# Patient Record
Sex: Male | Born: 1970 | Race: Black or African American | Hispanic: No | Marital: Married | Smoking: Never smoker
Health system: Southern US, Community
[De-identification: ages and names within clinical notes are randomized; demographics above are authoritative.]

## PROBLEM LIST (undated history)

## (undated) DIAGNOSIS — E785 Hyperlipidemia, unspecified: Secondary | ICD-10-CM

## (undated) DIAGNOSIS — Z789 Other specified health status: Secondary | ICD-10-CM

## (undated) HISTORY — PX: ACHILLES TENDON REPAIR: SUR1153

---

## 1998-11-17 ENCOUNTER — Emergency Department: Admit: 1998-11-17 | Payer: Self-pay | Admitting: Emergency Medicine

## 2003-08-06 ENCOUNTER — Emergency Department (HOSPITAL_COMMUNITY): Admission: EM | Admit: 2003-08-06 | Discharge: 2003-08-06 | Payer: Self-pay | Admitting: *Deleted

## 2012-08-27 ENCOUNTER — Ambulatory Visit (HOSPITAL_COMMUNITY): Admit: 2012-08-27 | Payer: Self-pay | Admitting: Cardiology

## 2012-08-27 ENCOUNTER — Inpatient Hospital Stay (HOSPITAL_COMMUNITY)
Admission: EM | Admit: 2012-08-27 | Discharge: 2012-08-30 | DRG: 853 | Disposition: A | Discharge status: Home / Self Care | Payer: BC Managed Care – PPO | Attending: Cardiology | Admitting: Cardiology

## 2012-08-27 ENCOUNTER — Encounter (HOSPITAL_COMMUNITY): Payer: Self-pay | Admitting: *Deleted

## 2012-08-27 ENCOUNTER — Emergency Department (HOSPITAL_COMMUNITY): Payer: BC Managed Care – PPO

## 2012-08-27 ENCOUNTER — Encounter (HOSPITAL_COMMUNITY)
Admission: EM | Disposition: A | Discharge status: Home / Self Care | Payer: Self-pay | Source: Home / Self Care | Attending: Cardiology

## 2012-08-27 DIAGNOSIS — I219 Acute myocardial infarction, unspecified: Secondary | ICD-10-CM

## 2012-08-27 DIAGNOSIS — I517 Cardiomegaly: Secondary | ICD-10-CM

## 2012-08-27 DIAGNOSIS — I214 Non-ST elevation (NSTEMI) myocardial infarction: Secondary | ICD-10-CM | POA: Diagnosis present

## 2012-08-27 DIAGNOSIS — I2589 Other forms of chronic ischemic heart disease: Secondary | ICD-10-CM | POA: Diagnosis present

## 2012-08-27 DIAGNOSIS — Z7902 Long term (current) use of antithrombotics/antiplatelets: Secondary | ICD-10-CM

## 2012-08-27 DIAGNOSIS — I472 Ventricular tachycardia, unspecified: Secondary | ICD-10-CM | POA: Diagnosis not present

## 2012-08-27 DIAGNOSIS — I2510 Atherosclerotic heart disease of native coronary artery without angina pectoris: Secondary | ICD-10-CM | POA: Diagnosis present

## 2012-08-27 DIAGNOSIS — Z7982 Long term (current) use of aspirin: Secondary | ICD-10-CM

## 2012-08-27 DIAGNOSIS — E782 Mixed hyperlipidemia: Secondary | ICD-10-CM | POA: Diagnosis present

## 2012-08-27 DIAGNOSIS — Z79899 Other long term (current) drug therapy: Secondary | ICD-10-CM

## 2012-08-27 DIAGNOSIS — I4729 Other ventricular tachycardia: Secondary | ICD-10-CM | POA: Diagnosis not present

## 2012-08-27 DIAGNOSIS — I255 Ischemic cardiomyopathy: Secondary | ICD-10-CM

## 2012-08-27 DIAGNOSIS — R079 Chest pain, unspecified: Secondary | ICD-10-CM | POA: Diagnosis present

## 2012-08-27 HISTORY — PX: CARDIAC CATHETERIZATION: SHX172

## 2012-08-27 HISTORY — DX: Acute myocardial infarction, unspecified: I21.9

## 2012-08-27 HISTORY — DX: Other specified health status: Z78.9

## 2012-08-27 HISTORY — PX: LEFT HEART CATHETERIZATION WITH CORONARY ANGIOGRAM: SHX5451

## 2012-08-27 LAB — COMPREHENSIVE METABOLIC PANEL
ALT: 53 U/L (ref 0–53)
CO2: 23 mEq/L (ref 19–32)
Calcium: 8.6 mg/dL (ref 8.4–10.5)
Creatinine, Ser: 1.13 mg/dL (ref 0.50–1.35)
GFR calc Af Amer: >90 mL/min (ref >90)
GFR calc non Af Amer: 79 mL/min — ABNORMAL LOW (ref >90)
Glucose, Bld: 126 mg/dL — ABNORMAL HIGH (ref 70–99)
Total Bilirubin: 0.5 mg/dL (ref 0.3–1.2)

## 2012-08-27 LAB — POCT I-STAT TROPONIN I: Troponin i, poc: 0.24 ng/mL (ref 0.00–0.08)

## 2012-08-27 LAB — BASIC METABOLIC PANEL
Calcium: 9.1 mg/dL (ref 8.4–10.5)
GFR calc Af Amer: 84 mL/min — ABNORMAL LOW (ref >90)
GFR calc non Af Amer: 72 mL/min — ABNORMAL LOW (ref >90)
Potassium: 4 mEq/L (ref 3.5–5.1)
Sodium: 136 mEq/L (ref 135–145)

## 2012-08-27 LAB — TROPONIN I
Troponin I: 12.96 ng/mL (ref <0.30)
Troponin I: 12.64 ng/mL (ref <0.30)

## 2012-08-27 LAB — HEMOGLOBIN A1C: Mean Plasma Glucose: 117 mg/dL — ABNORMAL HIGH (ref <117)

## 2012-08-27 LAB — CBC
MCH: 29.8 pg (ref 26.0–34.0)
MCHC: 36.5 g/dL — ABNORMAL HIGH (ref 30.0–36.0)
Platelets: 202 10*3/uL (ref 150–400)
RDW: 12.9 % (ref 11.5–15.5)

## 2012-08-27 LAB — PROTIME-INR
INR: 0.93 (ref 0.00–1.49)
Prothrombin Time: 13 seconds (ref 11.6–15.2)

## 2012-08-27 LAB — MRSA PCR SCREENING: MRSA by PCR: NEGATIVE

## 2012-08-27 SURGERY — LEFT HEART CATHETERIZATION WITH CORONARY ANGIOGRAM
Anesthesia: LOCAL

## 2012-08-27 MED ORDER — ASPIRIN EC 81 MG PO TBEC
81.0000 mg | DELAYED_RELEASE_TABLET | Freq: Every day | ORAL | Status: DC
  Administered 2012-08-28 – 2012-08-30 (×3): 81 mg via ORAL
  Filled 2012-08-27 (×3): qty 1

## 2012-08-27 MED ORDER — IOHEXOL 350 MG/ML SOLN
100.0000 mL | Freq: Once | INTRAVENOUS | Status: AC | PRN
  Administered 2012-08-27: 100 mL via INTRAVENOUS

## 2012-08-27 MED ORDER — NITROGLYCERIN 0.4 MG SL SUBL: 0.4000 mg | SUBLINGUAL_TABLET | SUBLINGUAL | Status: DC | PRN

## 2012-08-27 MED ORDER — LIDOCAINE HCL (PF) 1 % IJ SOLN
INTRAMUSCULAR | Status: AC
  Filled 2012-08-27: qty 30

## 2012-08-27 MED ORDER — DIAZEPAM 5 MG PO TABS
ORAL_TABLET | ORAL | Status: AC
  Administered 2012-08-27: 5 mg
  Filled 2012-08-27: qty 1

## 2012-08-27 MED ORDER — SODIUM CHLORIDE 0.9 % IV SOLN: 250.0000 mL | INTRAVENOUS | Status: DC | PRN

## 2012-08-27 MED ORDER — NITROGLYCERIN IN D5W 200-5 MCG/ML-% IV SOLN
10.0000 ug/min | INTRAVENOUS | Status: DC
  Administered 2012-08-27: 10 ug/min via INTRAVENOUS
  Filled 2012-08-27: qty 250

## 2012-08-27 MED ORDER — MORPHINE SULFATE 4 MG/ML IJ SOLN
4.0000 mg | Freq: Once | INTRAMUSCULAR | Status: AC
  Administered 2012-08-27: 4 mg via INTRAVENOUS
  Filled 2012-08-27: qty 1

## 2012-08-27 MED ORDER — DIAZEPAM 5 MG PO TABS
5.0000 mg | ORAL_TABLET | ORAL | Status: AC
  Administered 2012-08-27: 5 mg via ORAL

## 2012-08-27 MED ORDER — MIDAZOLAM HCL 2 MG/2ML IJ SOLN
INTRAMUSCULAR | Status: AC
  Filled 2012-08-27: qty 2

## 2012-08-27 MED ORDER — SODIUM CHLORIDE 0.9 % IJ SOLN: 3.0000 mL | INTRAMUSCULAR | Status: DC | PRN

## 2012-08-27 MED ORDER — ONDANSETRON HCL 4 MG/2ML IJ SOLN: 4.0000 mg | Freq: Four times a day (QID) | INTRAMUSCULAR | Status: DC | PRN

## 2012-08-27 MED ORDER — VERAPAMIL HCL 2.5 MG/ML IV SOLN
INTRAVENOUS | Status: AC
  Filled 2012-08-27: qty 2

## 2012-08-27 MED ORDER — HEPARIN SODIUM (PORCINE) 5000 UNIT/ML IJ SOLN
5000.0000 [IU] | Freq: Three times a day (TID) | INTRAMUSCULAR | Status: DC
  Administered 2012-08-27 – 2012-08-30 (×7): 5000 [IU] via SUBCUTANEOUS
  Filled 2012-08-27 (×11): qty 1

## 2012-08-27 MED ORDER — SODIUM CHLORIDE 0.9 % IJ SOLN: 3.0000 mL | Freq: Two times a day (BID) | INTRAMUSCULAR | Status: DC

## 2012-08-27 MED ORDER — HEPARIN BOLUS VIA INFUSION
1400.0000 [IU] | Freq: Once | INTRAVENOUS | Status: AC
  Administered 2012-08-27: 1400 [IU] via INTRAVENOUS
  Filled 2012-08-27: qty 1400

## 2012-08-27 MED ORDER — ATORVASTATIN CALCIUM 40 MG PO TABS
40.0000 mg | ORAL_TABLET | Freq: Every day | ORAL | Status: DC
  Administered 2012-08-27 – 2012-08-29 (×3): 40 mg via ORAL
  Filled 2012-08-27 (×4): qty 1

## 2012-08-27 MED ORDER — METOPROLOL TARTRATE 25 MG PO TABS
25.0000 mg | ORAL_TABLET | Freq: Two times a day (BID) | ORAL | Status: DC
  Administered 2012-08-27: 25 mg via ORAL
  Filled 2012-08-27 (×3): qty 1

## 2012-08-27 MED ORDER — ASPIRIN 81 MG PO CHEW
324.0000 mg | CHEWABLE_TABLET | Freq: Once | ORAL | Status: AC
  Administered 2012-08-27: 324 mg via ORAL
  Filled 2012-08-27: qty 1

## 2012-08-27 MED ORDER — HEPARIN (PORCINE) IN NACL 100-0.45 UNIT/ML-% IJ SOLN
1500.0000 [IU]/h | INTRAMUSCULAR | Status: DC
  Administered 2012-08-27: 1300 [IU]/h via INTRAVENOUS
  Administered 2012-08-27: 1500 [IU]/h via INTRAVENOUS
  Filled 2012-08-27 (×2): qty 250

## 2012-08-27 MED ORDER — SODIUM CHLORIDE 0.9 % IV SOLN: 1.0000 mL/kg/h | INTRAVENOUS | Status: AC

## 2012-08-27 MED ORDER — HEPARIN SODIUM (PORCINE) 1000 UNIT/ML IJ SOLN
INTRAMUSCULAR | Status: AC
  Filled 2012-08-27: qty 1

## 2012-08-27 MED ORDER — HEPARIN (PORCINE) IN NACL 2-0.9 UNIT/ML-% IJ SOLN
INTRAMUSCULAR | Status: AC
  Filled 2012-08-27: qty 1000

## 2012-08-27 MED ORDER — PRASUGREL HCL 10 MG PO TABS
ORAL_TABLET | ORAL | Status: AC
Start: 2012-08-27 — End: 2012-08-27
  Filled 2012-08-27: qty 6

## 2012-08-27 MED ORDER — PRASUGREL HCL 10 MG PO TABS
10.0000 mg | ORAL_TABLET | Freq: Every day | ORAL | Status: DC
  Administered 2012-08-28 – 2012-08-30 (×3): 10 mg via ORAL
  Filled 2012-08-27 (×3): qty 1

## 2012-08-27 MED ORDER — SODIUM CHLORIDE 0.9 % IV SOLN: INTRAVENOUS | Status: DC

## 2012-08-27 MED ORDER — HEPARIN BOLUS VIA INFUSION
4000.0000 [IU] | Freq: Once | INTRAVENOUS | Status: AC
  Administered 2012-08-27: 4000 [IU] via INTRAVENOUS

## 2012-08-27 MED ORDER — ACETAMINOPHEN 325 MG PO TABS
650.0000 mg | ORAL_TABLET | ORAL | Status: DC | PRN
  Administered 2012-08-27: 650 mg via ORAL
  Filled 2012-08-27: qty 2

## 2012-08-27 MED ORDER — ASPIRIN 81 MG PO CHEW
CHEWABLE_TABLET | ORAL | Status: AC
  Filled 2012-08-27: qty 1

## 2012-08-27 MED ORDER — SODIUM CHLORIDE 0.9 % IV SOLN
INTRAVENOUS | Status: DC
  Administered 2012-08-27: 10 mL/h via INTRAVENOUS

## 2012-08-27 MED ORDER — ASPIRIN 81 MG PO CHEW: 324.0000 mg | CHEWABLE_TABLET | ORAL | Status: DC

## 2012-08-27 NOTE — ED Notes (Signed)
Dr. Turner at [facility].  

## 2012-08-27 NOTE — ED Notes (Signed)
EDP at [facility] 

## 2012-08-27 NOTE — Progress Notes (Signed)
  Echocardiogram 2D Echocardiogram has been performed.  Hailyn Zarr 08/27/2012, 1:13 PM

## 2012-08-27 NOTE — Interval H&P Note (Signed)
History and Physical Interval Note:  08/27/2012 11:02 AM  Ian Martin  has presented today for surgery, with the diagnosis of URGENT  The various methods of treatment have been discussed with the patient and family. After consideration of risks, benefits and other options for treatment, the patient has consented to  Procedure(s): LEFT HEART CATHETERIZATION WITH CORONARY ANGIOGRAM (N/A) as a surgical intervention .  The patient's history has been reviewed, patient examined, no change in status, stable for surgery.  I have reviewed the patient's chart and labs.  Questions were answered to the patient's satisfaction.    Cath Lab Visit (complete for each Cath Lab visit)  Clinical Evaluation Leading to the Procedure:   ACS: yes  Non-ACS:    Anginal Classification: CCS IV  Anti-ischemic medical therapy: No Therapy  Non-Invasive Test Results: No non-invasive testing performed  Prior CABG: No previous CABG       [name] [facility] 08/27/2012 11:03 AM

## 2012-08-27 NOTE — H&P (Addendum)
Admit date: 08/27/2012 Referring Physician Dr. Preston Fleeting Primary Cardiologist NONE Chief complaint/reason for admission: Chest pain/palpitations  HPI: This is a 42yo AAM with no prior medical history who presented to the Er with complaints of sharp chest pain.  He says that Thursday he found out his grandmother was back in the hospital and he started having CP and diaphoresis.  He thought it was anxiety and went home and took some ASA and laid down and it improved.  He got up to get his wife from the train station and he was still having pain but it was improved.  The pain lasted about 4 hours and completely stopped.  He drove from [place] a week ago visiting his family.  Last night the pain came back while at dinner.  He became diaphoretic and took a BC powder and a bayer ASA.  He has also noticed his heart racing last night as well.  He felt dizzy but did not pass out.  He had a hard time telling his wife how to get to the Er and was confused.  He describes the pain as an intense sharp pain over the midsternal area.  He also describes a tightness in his chest as well.  Currently he is pain free.    PMH:   History reviewed. No pertinent past medical history.  PSH:    Past Surgical History  Procedure Laterality Date  . Achilles tendon repair      ALLERGIES:   Review of patient's allergies indicates no known allergies.  Prior to Admit Meds:   (Not in a hospital admission) Family HX:   No family history of CAD. Social HX:    History   Social History  . Marital Status: Single    Spouse Name: N/A    Number of Children: N/A  . Years of Education: N/A   Occupational History  . Not on file.   Social History Main Topics  . Smoking status: Never Smoker   . Smokeless tobacco: Not on file  . Alcohol Use: Yes - occasional  . Drug Use: No  . Sexual Activity: Not on file   Other Topics Concern  . Not on file   Social History Narrative  . No narrative on file     ROS:  All 11 ROS were addressed  and are negative except what is stated in the HPI  PHYSICAL EXAM Filed Vitals:   08/27/12 0445  BP: 124/83  Pulse: 86  Temp:   Resp: 14   General: Well developed, well nourished, in no acute distress Head: Eyes PERRLA, No xanthomas.   Normal cephalic and atramatic  Lungs:   Clear bilaterally to auscultation and percussion. Heart:   HRRR S1 S2 Pulses are 2+ & equal.            No carotid bruit. No JVD.  No abdominal bruits. No femoral bruits. Abdomen: Bowel sounds are positive, abdomen soft and non-tender without masses  Extremities:   No clubbing, cyanosis or edema.  DP +1 Neuro: Alert and oriented X 3. Psych:  Good affect, responds appropriately   Labs:   Lab Results  Component Value Date   WBC 7.2 08/27/2012   HGB 17.6* 08/27/2012   HCT 48.2 08/27/2012   MCV 81.7 08/27/2012   PLT 202 08/27/2012    Recent Labs Lab 08/27/12 0204  NA 136  K 4.0  CL 99  CO2 27  BUN 14  CREATININE 1.21  CALCIUM 9.1  GLUCOSE 150*  Lab Results  Component Value Date   TROPONINI 1.20* 08/27/2012   No results found for this basename: PTT   Lab Results  Component Value Date   INR 0.93 08/27/2012         Radiology:   *RADIOLOGY REPORT*  Clinical Data: Chest pain  CHEST - 2 VIEW  Comparison: None.  Findings: The lungs are well-aerated and free from pulmonary  edema, focal airspace consolidation or pulmonary nodule. Cardiac  and mediastinal contours are within normal limits. No  pneumothorax, or pleural effusion. No acute osseous findings.  IMPRESSION:  No acute cardiopulmonary disease.  Original Report Authenticated By: Malachy Moan, M.D.       EKG:  NSR with no ST changes, Q waves in III and aVF, poor R wave progression in anterior precordial leads  ASSESSMENT:  1.  NSTEMI with some typical and atypical components to his chest pain.  Prelim CT angio with ? Subsegmental PE but could be artifact per Radiologist.  He has no cardiac risk factors except male sex and age over  42.  This did occur in the setting of severe stress after finding out his grandmother was in the hospital so could represent "Broken Heart syndrome". 2.  Palpitations with NSVT noted on heart monitor in ER as well as AIVR which could represent reperfusion arrhythmia.  PLAN:   1.  Admit to CCU 2.  Cycle cardiac enzymes 3.  IV Heparin and NTG gtt - if no dissection 4.  2D echo today to eval LVF 5.  ASA/statin/Lopressor 25mg  BID 6.  Further workup per [facility] with cardiac cath  7.  Check final reading on chest CT that was not yet reported at time of this dictation. Quintella Reichert, MD  08/27/2012  6:33 AM

## 2012-08-27 NOTE — Progress Notes (Signed)
ANTICOAGULATION CONSULT NOTE - Initial Consult  Pharmacy Consult for Heparin Indication: chest pain/ACS  No Known Allergies  Patient Measurements:  Weight: 115kg Heparin Dosing Weight: 98.5kg  Vital Signs: Temp: 98.8 F (37.1 C) (08/16 0154) Temp src: Oral (08/16 0154) BP: 132/83 mmHg (08/16 0321) Pulse Rate: 85 (08/16 0321)  Labs:  Recent Labs  08/27/12 0204  HGB 17.6*  HCT 48.2  PLT 202  CREATININE 1.21    CrCl is unknown because there is no height on file for the current visit.   Medical History: History reviewed. No pertinent past medical history.  Medications:  See electronic med rec  Assessment: Ian Martin admitted with CP to start heparin for ACS. Patient reports no bleeding and not on any anticoagulants. - H/H and Plts wnl - Heparin weight: 98.5kg  Goal of Therapy:  Heparin level 0.3-0.7 units/ml Monitor platelets by anticoagulation protocol: Yes   Plan:  1. Heparin IV bolus 4000 units x 1 2. Heparin drip 1300 units/hr (13 ml/hr) 3. Check heparin level 6 hours after initiation 4. Daily heparin level and CBC  [name] [phone] 08/27/2012,3:25 AM

## 2012-08-27 NOTE — ED Notes (Signed)
The pt has had mid chest pain for 3 days.  He was seen at ucc  2 days ago and was told to return here if the pain continued.   C/o sob  None now.  No cardiac history

## 2012-08-27 NOTE — Progress Notes (Signed)
Chaplain Note:  Chaplain responded to code stemi. Pt was in his CICU room, in bed, awake, alert, and fully oriented.  Pt's wife was at bedside.  Chaplain provided spiritual comfort support and prayer for pt, pt's wife, and cath lab team prior to pt's procedure.  During procedure, chaplain provided communication between cath lab and pt's wife and family that arrived later.  Pt, family, and cath lab staff expressed appreciation for chaplain support.  Chaplain will follow up as needed.  08/27/12 1100  Clinical Encounter Type  Visited With Patient;Family;Health care provider  Visit Type Spiritual support;Code (Code Stemi)  Referral From Physician  Spiritual Encounters  Spiritual Needs Prayer;Emotional  Stress Factors  Patient Stress Factors Health changes;Major life changes  Family Stress Factors Major life changes  Verdie Shire, [place]  [phone]

## 2012-08-27 NOTE — Progress Notes (Signed)
DR. [place] NOTIFIED FOR RIGHT RADIAL OOZING SITE.  TR BAND REINFLATED PER PROTOCOL.  WILL CONTINUE TO MONITOR.

## 2012-08-27 NOTE — Progress Notes (Signed)
Patient ID: Ian Martin, male   DOB: Apr 16, 1970, 42 y.o.   MRN: [id number]    SUBJECTIVE: The patient was admitted with chest pain this morning. There was slight troponin elevation initially. The patient is feeling better but has continued mild chest discomfort. Troponin has gone up to 12. Repeat EKG reveals new T wave abnormalities in leads V2 V3 V4 and V5. There are small inferior Q waves. These were present on the tracing earlier today.    Filed Vitals:   08/27/12 0815 08/27/12 0830 08/27/12 0845 08/27/12 0900  BP: 148/84 142/81 139/73 139/73  Pulse: 85 86 84 78  Temp: 98.3 F (36.8 C)   98.3 F (36.8 C)  TempSrc: Oral   Oral  Resp: 22 16 16    Height: 5\' 10"  (1.778 m)   5\' 10"  (1.778 m)  Weight: 250 lb (113.399 kg)   250 lb (113.399 kg)  SpO2: 100% 100% 100% 99%    Intake/Output Summary (Last 24 hours) at 08/27/12 1033 Last data filed at 08/27/12 0800  Gross per 24 hour  Intake  70.31 ml  Output    600 ml  Net -529.69 ml    LABS: Basic Metabolic Panel:  Recent Labs  40/98/11 0204 08/27/12 0848  NA 136 135  K 4.0 3.9  CL 99 102  CO2 27 23  GLUCOSE 150* 126*  BUN 14 13  CREATININE 1.21 1.13  CALCIUM 9.1 8.6   Liver Function Tests:  Recent Labs  08/27/12 0848  AST 99*  ALT 53  ALKPHOS 62  BILITOT 0.5  PROT 6.7  ALBUMIN 3.7   No results found for this basename: LIPASE, AMYLASE,  in the last 72 hours CBC:  Recent Labs  08/27/12 0204  WBC 7.2  HGB 17.6*  HCT 48.2  MCV 81.7  PLT 202   Cardiac Enzymes:  Recent Labs  08/27/12 0305 08/27/12 0848  TROPONINI 1.20* 12.96*   BNP: No components found with this basename: POCBNP,  D-Dimer: No results found for this basename: DDIMER,  in the last 72 hours Hemoglobin A1C: No results found for this basename: HGBA1C,  in the last 72 hours Fasting Lipid Panel: No results found for this basename: CHOL, HDL, LDLCALC, TRIG, CHOLHDL, LDLDIRECT,  in the last 72 hours Thyroid Function Tests: No results  found for this basename: TSH, T4TOTAL, FREET3, T3FREE, THYROIDAB,  in the last 72 hours  RADIOLOGY: Dg Chest 2 View  08/27/2012   *RADIOLOGY REPORT*  Clinical Data: Chest pain  CHEST - 2 VIEW  Comparison: None.  Findings: The lungs are well-aerated and free from pulmonary edema, focal airspace consolidation or pulmonary nodule.  Cardiac and mediastinal contours are within normal limits.  No pneumothorax, or pleural effusion. No acute osseous findings.  IMPRESSION:  No acute cardiopulmonary disease.   Original Report Authenticated By: Malachy Moan, M.D.   Ct Angio Chest Pe W/cm &/or Wo Cm  08/27/2012   *RADIOLOGY REPORT*  Clinical Data: Chest pain  CT ANGIOGRAPHY CHEST  Technique:  Multidetector CT imaging of the chest using the standard protocol during bolus administration of intravenous contrast. Multiplanar reconstructed images including MIPs were obtained and reviewed to evaluate the vascular anatomy.  Contrast: OMNIPAQUE IOHEXOL 350 MG/ML SOLN  Comparison: Chest x-ray obtained earlier today  Findings:  Mediastinum: Unremarkable CT appearance of the thyroid gland.  No suspicious mediastinal or hilar adenopathy.  No soft tissue mediastinal mass.  The thoracic esophagus is unremarkable.  Heart/Vascular: Limited opacification of the pulmonary arteries secondary  to contrast bolus timing.  The pulmonary arteries are opacified to the proximal lobar level.  No evidence of large and central pulmonary embolus. There is a solitary probable acute segmental PE with an AL right apical segmental pulmonary artery. However, this vessel lies in a region of streak artifact adjacent to the contrast-filled superior vena cava and is also in a region of respiratory motion.  Borderline cardiomegaly with left ventricular enlargement.  No pericardial effusion.  Lungs/Pleura: Mild dependent atelectasis in the bilateral lower lobes.  Respiratory motion limits evaluation for small pulmonary nodules.  No focal airspace  consolidation, pleural effusion or pneumothorax.  Upper Abdomen: Solitary calcification within the liver is nonspecific.  Otherwise, the visualized upper abdomen is unremarkable.  Bones: No acute fracture or aggressive appearing lytic or blastic osseous lesion.  IMPRESSION:  1. Limited evaluation of the pulmonary artery secondary to contrast bolus timing.  There is a questionable solitary segmental pulmonary embolus and a new right apical segmental pulmonary artery. However, this artery lies in a region of both streak and motion artifact.  If clinically warranted, CT PE study could be repeated in 12-24 hours.  Alternately, the bilateral lower extremity DVT ultrasound could be considered.  2.  Cardiomegaly with left ventricular enlargement.  These results were called by telephone on 08/27/2012 at 6:30 am to Dr. Preston Fleeting, who verbally acknowledged these results.   Original Report Authenticated By: Malachy Moan, M.D.    PHYSICAL EXAM  the patient is having mild chest discomfort. He is oriented to person time and place. Affect is normal. His wife is in the room. Cardiac exam reveals S1 and S2. His lungs are clear.   TELEMETRY: I reviewed telemetry. There is normal sinus rhythm.   ASSESSMENT AND PLAN:    Non-STEMI (non-ST elevated myocardial infarction)    The patient is having a non-STEMI. He has ongoing chest discomfort. His troponins are rising and his EKG is becoming more abnormal. I've had a full discussion with the patient and his wife. I explained that we need to proceed with urgent cardiac catheterization. I explained the risks and benefits. The patient and his wife are in agreement. I have spoken with Dr. [place] by telephone. He will be doing the procedure. I have made the phone contacts so that [facility] will call the cath lab in an immediately.  As part of this evaluation I have spent greater than 30 minutes of critical care time at the patient's bedside arranging all of the plans  above.   Willa Rough 08/27/2012 10:33 AM

## 2012-08-27 NOTE — H&P (View-Only) (Signed)
Patient ID: Ian Martin, male   DOB: 01/16/1970, 41 y.o.   MRN: [id number]    SUBJECTIVE: The patient was admitted with chest pain this morning. There was slight troponin elevation initially. The patient is feeling better but has continued mild chest discomfort. Troponin has gone up to 12. Repeat EKG reveals new T wave abnormalities in leads V2 V3 V4 and V5. There are small inferior Q waves. These were present on the tracing earlier today.    Filed Vitals:   08/27/12 0815 08/27/12 0830 08/27/12 0845 08/27/12 0900  BP: 148/84 142/81 139/73 139/73  Pulse: 85 86 84 78  Temp: 98.3 F (36.8 C)   98.3 F (36.8 C)  TempSrc: Oral   Oral  Resp: 22 16 16   Height: 5' 10" (1.778 m)   5' 10" (1.778 m)  Weight: 250 lb (113.399 kg)   250 lb (113.399 kg)  SpO2: 100% 100% 100% 99%    Intake/Output Summary (Last 24 hours) at 08/27/12 1033 Last data filed at 08/27/12 0800  Gross per 24 hour  Intake  70.31 ml  Output    600 ml  Net -529.69 ml    LABS: Basic Metabolic Panel:  Recent Labs  08/27/12 0204 08/27/12 0848  NA 136 135  K 4.0 3.9  CL 99 102  CO2 27 23  GLUCOSE 150* 126*  BUN 14 13  CREATININE 1.21 1.13  CALCIUM 9.1 8.6   Liver Function Tests:  Recent Labs  08/27/12 0848  AST 99*  ALT 53  ALKPHOS 62  BILITOT 0.5  PROT 6.7  ALBUMIN 3.7   No results found for this basename: LIPASE, AMYLASE,  in the last 72 hours CBC:  Recent Labs  08/27/12 0204  WBC 7.2  HGB 17.6*  HCT 48.2  MCV 81.7  PLT 202   Cardiac Enzymes:  Recent Labs  08/27/12 0305 08/27/12 0848  TROPONINI 1.20* 12.96*   BNP: No components found with this basename: POCBNP,  D-Dimer: No results found for this basename: DDIMER,  in the last 72 hours Hemoglobin A1C: No results found for this basename: HGBA1C,  in the last 72 hours Fasting Lipid Panel: No results found for this basename: CHOL, HDL, LDLCALC, TRIG, CHOLHDL, LDLDIRECT,  in the last 72 hours Thyroid Function Tests: No results  found for this basename: TSH, T4TOTAL, FREET3, T3FREE, THYROIDAB,  in the last 72 hours  RADIOLOGY: Dg Chest 2 View  08/27/2012   *RADIOLOGY REPORT*  Clinical Data: Chest pain  CHEST - 2 VIEW  Comparison: None.  Findings: The lungs are well-aerated and free from pulmonary edema, focal airspace consolidation or pulmonary nodule.  Cardiac and mediastinal contours are within normal limits.  No pneumothorax, or pleural effusion. No acute osseous findings.  IMPRESSION:  No acute cardiopulmonary disease.   Original Report Authenticated By: Heath McCullough, M.D.   Ct Angio Chest Pe W/cm &/or Wo Cm  08/27/2012   *RADIOLOGY REPORT*  Clinical Data: Chest pain  CT ANGIOGRAPHY CHEST  Technique:  Multidetector CT imaging of the chest using the standard protocol during bolus administration of intravenous contrast. Multiplanar reconstructed images including MIPs were obtained and reviewed to evaluate the vascular anatomy.  Contrast: 100mL OMNIPAQUE IOHEXOL 350 MG/ML SOLN  Comparison: Chest x-ray obtained earlier today  Findings:  Mediastinum: Unremarkable CT appearance of the thyroid gland.  No suspicious mediastinal or hilar adenopathy.  No soft tissue mediastinal mass.  The thoracic esophagus is unremarkable.  Heart/Vascular: Limited opacification of the pulmonary arteries secondary   to contrast bolus timing.  The pulmonary arteries are opacified to the proximal lobar level.  No evidence of large and central pulmonary embolus. There is a solitary probable acute segmental PE with an AL right apical segmental pulmonary artery. However, this vessel lies in a region of streak artifact adjacent to the contrast-filled superior vena cava and is also in a region of respiratory motion.  Borderline cardiomegaly with left ventricular enlargement.  No pericardial effusion.  Lungs/Pleura: Mild dependent atelectasis in the bilateral lower lobes.  Respiratory motion limits evaluation for small pulmonary nodules.  No focal airspace  consolidation, pleural effusion or pneumothorax.  Upper Abdomen: Solitary calcification within the liver is nonspecific.  Otherwise, the visualized upper abdomen is unremarkable.  Bones: No acute fracture or aggressive appearing lytic or blastic osseous lesion.  IMPRESSION:  1. Limited evaluation of the pulmonary artery secondary to contrast bolus timing.  There is a questionable solitary segmental pulmonary embolus and a new right apical segmental pulmonary artery. However, this artery lies in a region of both streak and motion artifact.  If clinically warranted, CT PE study could be repeated in 12-24 hours.  Alternately, the bilateral lower extremity DVT ultrasound could be considered.  2.  Cardiomegaly with left ventricular enlargement.  These results were called by telephone on 08/27/2012 at 6:30 am to Dr. Glick, who verbally acknowledged these results.   Original Report Authenticated By: Heath McCullough, M.D.    PHYSICAL EXAM  the patient is having mild chest discomfort. He is oriented to person time and place. Affect is normal. His wife is in the room. Cardiac exam reveals S1 and S2. His lungs are clear.   TELEMETRY: I reviewed telemetry. There is normal sinus rhythm.   ASSESSMENT AND PLAN:    Non-STEMI (non-ST elevated myocardial infarction)    The patient is having a non-STEMI. He has ongoing chest discomfort. His troponins are rising and his EKG is becoming more abnormal. I've had a full discussion with the patient and his wife. I explained that we need to proceed with urgent cardiac catheterization. I explained the risks and benefits. The patient and his wife are in agreement. I have spoken with Dr. Jordan by telephone. He will be doing the procedure. I have made the phone contacts so that [facility] will call the cath lab in an immediately.  As part of this evaluation I have spent greater than 30 minutes of critical care time at the patient's bedside arranging all of the plans  above.   Ian Martin 08/27/2012 10:33 AM  

## 2012-08-27 NOTE — Progress Notes (Signed)
ANTICOAGULATION CONSULT NOTE - Follow Up Consult  Pharmacy Consult for Heparin Indication: chest pain/ACS  No Known Allergies  Patient Measurements: Height: 5\' 10"  (177.8 cm) Weight: 250 lb (113.399 kg) IBW/kg (Calculated) : 73 Heparin Dosing Weight: 98.5 kg  Vital Signs: Temp: 98.3 F (36.8 C) (08/16 0900) Temp src: Oral (08/16 0900) BP: 139/73 mmHg (08/16 0900) Pulse Rate: 78 (08/16 0900)  Labs:  Recent Labs  08/27/12 0204 08/27/12 0250 08/27/12 0305 08/27/12 0848  HGB 17.6*  --   --   --   HCT 48.2  --   --   --   PLT 202  --   --   --   APTT  --  34  --  75*  LABPROT  --  12.3  --  13.0  INR  --  0.93  --  1.00  HEPARINUNFRC  --   --   --  0.22*  CREATININE 1.21  --   --  1.13  TROPONINI  --   --  1.20* 12.96*    Estimated Creatinine Clearance: 107.4 ml/min (by C-G formula based on Cr of 1.13).   Medications:  Infusions:  . heparin 1,300 Units/hr (08/27/12 0355)  . nitroGLYCERIN 30 mcg/min (08/27/12 0800)    Assessment: 42 y/o male with no PMH on heparin for CP with positive troponins. He is still having chest discomfort and is going to the cath lab today. Heparin level is subtherapeutic at 0.22 on 1300 units/hr and was drawn a little early. No bleeding noted, CBC wnl.  Goal of Therapy:  Heparin level 0.3-0.7 units/ml Monitor platelets by anticoagulation protocol: Yes   Plan:  -Heparin 1400 units IV bolus then increase rate to 1500 units/hr -Will f/u after cath and recheck heparin level if needed  [facility], Meta.D., BCPS Clinical Pharmacist Pager: [phone] 08/27/2012 10:49 AM

## 2012-08-27 NOTE — CV Procedure (Signed)
   Cardiac Catheterization Procedure Note  Name: Ian Martin MRN: [id number] DOB: 07/29/1970  Procedure: Left Heart Cath, Selective Coronary Angiography, LV angiography, PTCA and stenting of the mid LAD  Indication: 42 yo BM presents with a NSTEMI with refractory chest pain despite optimal medical therapy. Class IV angina.  Procedural Details:  The right wrist was prepped, draped, and anesthetized with 1% lidocaine. Using the modified Seldinger technique, a 6 French sheath was introduced into the right radial artery. 3 mg of verapamil was administered through the sheath, weight-based unfractionated heparin was administered intravenously. Standard Judkins catheters were used for selective coronary angiography and left ventriculography. Catheter exchanges were performed over an exchange length guidewire.  PROCEDURAL FINDINGS Hemodynamics: AO 113/82 mean 95 mm Hg LV 115/11 mm Hg   Coronary angiography: Coronary dominance: right  Left mainstem: Normal  Left anterior descending (LAD):  90% mid vessel stenosis. Large vessel. The first diagonal has mild disease less than 20%.  Ramus intermediate with less than 20% disease.  Left circumflex (LCx): Gives rise to 2 OM branches. Mild irregularities less than 10%.  Right coronary artery (RCA): Large dominant vessel with diffuse mild disease up to 20%.  Left ventriculography: Left ventricular systolic function is abnormal, LVEF is estimated at 45-50%, there is anterior wall hypokinesis with focal apical dyskinesis, there is no significant mitral regurgitation   PCI Note:  Following the diagnostic procedure, the decision was made to proceed with PCI.  Weight-based heparin was given for anticoagulation. Effient 60 mg was given orally. Once a therapeutic ACT was achieved, a 6 [place] XBLAD 3.5 guide catheter was inserted.  A prowateer coronary guidewire was used to cross the lesion.  The lesion was predilated with a 2.5 mm balloon.  The lesion  was then stented with a 3.5 x 24 mm Promus premier stent.    Following PCI, there was 0% residual stenosis and TIMI-3 flow. Final angiography confirmed an excellent result. The patient tolerated the procedure well. There were no immediate procedural complications. A TR band was used for radial hemostasis. The patient was transferred to the post catheterization recovery area for further monitoring.  PCI Data: Vessel - LAD/Segment - mid Percent Stenosis (pre)  90% TIMI-flow 2 Stent 3.5 x 24 mm Promus premier Percent Stenosis (post) 0% TIMI-flow (post) 3  Final Conclusions:   1. Single vessel obstructive CAD 2. Mild LV dysfunction. 3. Successful stenting of the mid LAD with a DES.   Recommendations:  Dual antiplatelet therapy for one year. Risk factor modification.  [name] [facility] 08/27/2012, 12:08 PM

## 2012-08-27 NOTE — ED Notes (Signed)
Pending arrival of cardiology. Pt sleeping. arousable to voice, no changes, alert, NAD, calm, no dyspnea. Pt and family updated. VSS.

## 2012-08-27 NOTE — ED Notes (Signed)
Family at bedside. 

## 2012-08-27 NOTE — ED Provider Notes (Signed)
CSN: [id number]     Arrival date & time 08/27/12  0139 History     First MD Initiated Contact with Patient 08/27/12 0255     Chief Complaint  Patient presents with  . Chest Pain   (Consider location/radiation/quality/duration/timing/severity/associated sxs/prior Treatment) Patient is a 42 y.o. male presenting with chest pain. The history is provided by the patient.  Chest Pain He had onset at about 10:30 PM of a sharp anterior chest pain without radiation. Pain was severe and he rated it at 9/10. It has subsided and it is down to 2/10. There is associated dyspnea, nausea, and diaphoresis. Nothing made it worse but it seemed to be better when he laid down. He had a similar episode of pain yesterday which lasted about 3 hours. He did go to urgent care where he was told that he had an elevated blood pressure. He's never had chest pain prior to these 2 episodes. His only significant cardiac risk factor is hyperlipidemia. He is a nonsmoker without known history of diabetes or hypertension and there is no significant family history of coronary artery disease.  History reviewed. No pertinent past medical history. History reviewed. No pertinent past surgical history. No family history on file. History  Substance Use Topics  . Smoking status: Never Smoker   . Smokeless tobacco: Not on file  . Alcohol Use: Yes    Review of Systems  Cardiovascular: Positive for chest pain.  All other systems reviewed and are negative.    Allergies  Review of patient's allergies indicates no known allergies.  Home Medications  No current outpatient prescriptions on file. BP 157/106  Pulse 85  Temp(Src) 98.8 F (37.1 C) (Oral)  Resp 17  SpO2 100% Physical Exam  Nursing note and vitals reviewed.  42 year old male, resting comfortably and in no acute distress. Vital signs are significant for hypertension with blood pressure 157/106. Oxygen saturation is 100%, which is normal. Head is normocephalic and  atraumatic. PERRLA, EOMI. Oropharynx is clear. Neck is nontender and supple without adenopathy or JVD. Back is nontender and there is no CVA tenderness. Lungs are clear without rales, wheezes, or rhonchi. Chest is nontender. Heart has regular rate and rhythm without murmur. Abdomen is soft, flat, nontender without masses or hepatosplenomegaly and peristalsis is normoactive. Extremities have no cyanosis or edema, full range of motion is present. Skin is warm and dry without rash. Neurologic: Mental status is normal, cranial nerves are intact, there are no motor or sensory deficits.  ED Course   Procedures (including critical care time)  Results for orders placed during the hospital encounter of 08/27/12  MRSA PCR SCREENING      Result Value Range   MRSA by PCR NEGATIVE  NEGATIVE  CBC      Result Value Range   WBC 7.2  4.0 - 10.5 K/uL   RBC 5.90 (*) 4.22 - 5.81 MIL/uL   Hemoglobin 17.6 (*) 13.0 - 17.0 g/dL   HCT 40.9  81.1 - 91.4 %   MCV 81.7  78.0 - 100.0 fL   MCH 29.8  26.0 - 34.0 pg   MCHC 36.5 (*) 30.0 - 36.0 g/dL   RDW 78.2  95.6 - 21.3 %   Platelets 202  150 - 400 K/uL  BASIC METABOLIC PANEL      Result Value Range   Sodium 136  135 - 145 mEq/L   Potassium 4.0  3.5 - 5.1 mEq/L   Chloride 99  96 - 112 mEq/L  CO2 27  19 - 32 mEq/L   Glucose, Bld 150 (*) 70 - 99 mg/dL   BUN 14  6 - 23 mg/dL   Creatinine, Ser 1.61  0.50 - 1.35 mg/dL   Calcium 9.1  8.4 - 09.6 mg/dL   GFR calc non Af Amer 72 (*) >90 mL/min   GFR calc Af Amer 84 (*) >90 mL/min  TROPONIN I      Result Value Range   Troponin I 1.20 (*) <0.30 ng/mL  PROTIME-INR      Result Value Range   Prothrombin Time 12.3  11.6 - 15.2 seconds   INR 0.93  0.00 - 1.49  APTT      Result Value Range   aPTT 34  24 - 37 seconds  HEPARIN LEVEL (UNFRACTIONATED)      Result Value Range   Heparin Unfractionated 0.22 (*) 0.30 - 0.70 IU/mL  TROPONIN I      Result Value Range   Troponin I 12.96 (*) <0.30 ng/mL  TROPONIN I       Result Value Range   Troponin I 17.74 (*) <0.30 ng/mL  TROPONIN I      Result Value Range   Troponin I 12.64 (*) <0.30 ng/mL  PROTIME-INR      Result Value Range   Prothrombin Time 13.0  11.6 - 15.2 seconds   INR 1.00  0.00 - 1.49  APTT      Result Value Range   aPTT 75 (*) 24 - 37 seconds  TSH      Result Value Range   TSH 3.061  0.350 - 4.500 uIU/mL  COMPREHENSIVE METABOLIC PANEL      Result Value Range   Sodium 135  135 - 145 mEq/L   Potassium 3.9  3.5 - 5.1 mEq/L   Chloride 102  96 - 112 mEq/L   CO2 23  19 - 32 mEq/L   Glucose, Bld 126 (*) 70 - 99 mg/dL   BUN 13  6 - 23 mg/dL   Creatinine, Ser 0.45  0.50 - 1.35 mg/dL   Calcium 8.6  8.4 - 40.9 mg/dL   Total Protein 6.7  6.0 - 8.3 g/dL   Albumin 3.7  3.5 - 5.2 g/dL   AST 99 (*) 0 - 37 U/L   ALT 53  0 - 53 U/L   Alkaline Phosphatase 62  39 - 117 U/L   Total Bilirubin 0.5  0.3 - 1.2 mg/dL   GFR calc non Af Amer 79 (*) >90 mL/min   GFR calc Af Amer >90  >90 mL/min  HEMOGLOBIN A1C      Result Value Range   Hemoglobin A1C 5.7 (*) <5.7 %   Mean Plasma Glucose 117 (*) <117 mg/dL  POCT I-STAT TROPONIN I      Result Value Range   Troponin i, poc 0.24 (*) 0.00 - 0.08 ng/mL   Comment NOTIFIED PHYSICIAN     Comment 3            Dg Chest 2 View  08/27/2012   *RADIOLOGY REPORT*  Clinical Data: Chest pain  CHEST - 2 VIEW  Comparison: None.  Findings: The lungs are well-aerated and free from pulmonary edema, focal airspace consolidation or pulmonary nodule.  Cardiac and mediastinal contours are within normal limits.  No pneumothorax, or pleural effusion. No acute osseous findings.  IMPRESSION:  No acute cardiopulmonary disease.   Original Report Authenticated By: Malachy Moan, M.D.   Ct Angio Chest Pe W/cm &/or Wo  Cm  08/27/2012   *RADIOLOGY REPORT*  Clinical Data: Chest pain  CT ANGIOGRAPHY CHEST  Technique:  Multidetector CT imaging of the chest using the standard protocol during bolus administration of intravenous  contrast. Multiplanar reconstructed images including MIPs were obtained and reviewed to evaluate the vascular anatomy.  Contrast: OMNIPAQUE IOHEXOL 350 MG/ML SOLN  Comparison: Chest x-ray obtained earlier today  Findings:  Mediastinum: Unremarkable CT appearance of the thyroid gland.  No suspicious mediastinal or hilar adenopathy.  No soft tissue mediastinal mass.  The thoracic esophagus is unremarkable.  Heart/Vascular: Limited opacification of the pulmonary arteries secondary to contrast bolus timing.  The pulmonary arteries are opacified to the proximal lobar level.  No evidence of large and central pulmonary embolus. There is a solitary probable acute segmental PE with an AL right apical segmental pulmonary artery. However, this vessel lies in a region of streak artifact adjacent to the contrast-filled superior vena cava and is also in a region of respiratory motion.  Borderline cardiomegaly with left ventricular enlargement.  No pericardial effusion.  Lungs/Pleura: Mild dependent atelectasis in the bilateral lower lobes.  Respiratory motion limits evaluation for small pulmonary nodules.  No focal airspace consolidation, pleural effusion or pneumothorax.  Upper Abdomen: Solitary calcification within the liver is nonspecific.  Otherwise, the visualized upper abdomen is unremarkable.  Bones: No acute fracture or aggressive appearing lytic or blastic osseous lesion.  IMPRESSION:  1. Limited evaluation of the pulmonary artery secondary to contrast bolus timing.  There is a questionable solitary segmental pulmonary embolus and a new right apical segmental pulmonary artery. However, this artery lies in a region of both streak and motion artifact.  If clinically warranted, CT PE study could be repeated in 12-24 hours.  Alternately, the bilateral lower extremity DVT ultrasound could be considered.  2.  Cardiomegaly with left ventricular enlargement.  These results were called by telephone on 08/27/2012 at 6:30 am  to Dr. Preston Fleeting, who verbally acknowledged these results.   Original Report Authenticated By: Malachy Moan, M.D.     Date: 08/27/2012  Rate: 90  Rhythm: normal sinus rhythm  QRS Axis: normal  Intervals: normal  ST/T Wave abnormalities: normal  Conduction Disutrbances:none  Narrative Interpretation: Poor R-wave progression across the precordium. No prior ECG available for comparison.  Old EKG Reviewed: none available   1. Non-STEMI (non-ST elevated myocardial infarction)     CRITICAL CARE Performed by: ZOXWR,UEAVW Total critical care time: 40 minutes Critical care time was exclusive of separately billable procedures and treating other patients. Critical care was necessary to treat or prevent imminent or life-threatening deterioration. Critical care was time spent personally by me on the following activities: development of treatment plan with patient and/or surrogate as well as nursing, discussions with consultants, evaluation of patient's response to treatment, examination of patient, obtaining history from patient or surrogate, ordering and performing treatments and interventions, ordering and review of laboratory studies, ordering and review of radiographic studies, pulse oximetry and re-evaluation of patient's condition.  MDM  Chest pain with an elevated point-of-care troponin. He is given aspirin and started on heparin and nitroglycerin. Main lab troponin will be checked and he'll need to be admitted. There are no prior hospital records for him.  Main lab troponin is significantly elevated. Case is discussed with Dr. Mayford Knife who is on call for cardiology. The patient drove from [place] and she wishes to have a CT angiogram done to make sure he does not have pulmonary emboli. He is already heparinized which would  be appropriate treatment for both ACS and pulmonary embolism.  CT angiogram showed no large pulmonary emboli. There is a questionable subsegmental pulmonary embolism but  that would not be sufficient to account for his elevated troponin. Dr. Mayford Knife as come in to evaluate the patient.  Dione Booze, MD 08/27/12 [phone]

## 2012-08-27 NOTE — ED Notes (Addendum)
C/o CP x3d, see recently for same, encouraged to return if it worsened, tonight pt became diaphoretic, sob, with sub sternal CP, pain is better now than previously, 8/10 upon arrival to room, PVCs and bigeminny noted on monitor (captured, see chart, EDP notified) , pt mentions distant h/o similar, denies sob at this time, skin still warm moist and clammy. Remains on monitor, frequent VS, family t BS, CBIR, rates pain 4/10.placed on O2 [place] 4L. ASA taken PTA.

## 2012-08-28 DIAGNOSIS — E782 Mixed hyperlipidemia: Secondary | ICD-10-CM

## 2012-08-28 LAB — LIPID PANEL
Total CHOL/HDL Ratio: 5.3 RATIO
VLDL: 25 mg/dL (ref 0–40)

## 2012-08-28 LAB — CBC
HCT: 42.8 % (ref 39.0–52.0)
MCH: 28.6 pg (ref 26.0–34.0)
MCHC: 34.6 g/dL (ref 30.0–36.0)
MCV: 82.6 fL (ref 78.0–100.0)
Platelets: 176 10*3/uL (ref 150–400)
RDW: 13.1 % (ref 11.5–15.5)

## 2012-08-28 LAB — BASIC METABOLIC PANEL
BUN: 9 mg/dL (ref 6–23)
CO2: 24 mEq/L (ref 19–32)
Calcium: 8.9 mg/dL (ref 8.4–10.5)
Chloride: 103 mEq/L (ref 96–112)
Creatinine, Ser: 1.2 mg/dL (ref 0.50–1.35)
GFR calc Af Amer: 85 mL/min — ABNORMAL LOW (ref >90)

## 2012-08-28 MED ORDER — CARVEDILOL 6.25 MG PO TABS
6.2500 mg | ORAL_TABLET | Freq: Two times a day (BID) | ORAL | Status: DC
  Administered 2012-08-28 – 2012-08-30 (×5): 6.25 mg via ORAL
  Filled 2012-08-28 (×7): qty 1

## 2012-08-28 NOTE — Progress Notes (Signed)
   Primary cardiologist: Dr. Zackery Barefoot  Subjective:   No chest pain or breathlessness. Tolerating PO's. No unusual pain at catheterization access site.   Objective:   Temp:  [98.3 F (36.8 C)-101.8 F (38.8 C)] 98.9 F (37.2 C) (08/17 0406) Pulse Rate:  [68-96] 77 (08/17 0613) Resp:  [10-33] 21 (08/17 0613) BP: (95-148)/(41-89) 103/64 mmHg (08/17 0613) SpO2:  [97 %-100 %] 100 % (08/17 4540) Weight:  [250 lb (113.399 kg)] 250 lb (113.399 kg) (08/16 0900)    Filed Weights   08/27/12 0815 08/27/12 0900  Weight: 250 lb (113.399 kg) 250 lb (113.399 kg)    Intake/Output Summary (Last 24 hours) at 08/28/12 0713 Last data filed at 08/28/12 0406  Gross per 24 hour  Intake 621.14 ml  Output   1100 ml  Net -478.86 ml   Telemetry: Sinus rhythm.  Exam:  General: Appears comfortable.  Lungs: Clear, nonlabored.  Cardiac: RRR, no gallop or rub.  Abdomen: NABS.  Extremities: No pitting. Radial wrist band in place due to oozing overnight, currently stable.   Lab Results:  Basic Metabolic Panel:  Recent Labs Lab 08/27/12 0204 08/27/12 0848 08/28/12 0547  NA 136 135 136  K 4.0 3.9 3.8  CL 99 102 103  CO2 27 23 24   GLUCOSE 150* 126* 110*  BUN 14 13 9   CREATININE 1.21 1.13 1.20  CALCIUM 9.1 8.6 8.9    Liver Function Tests:  Recent Labs Lab 08/27/12 0848  AST 99*  ALT 53  ALKPHOS 62  BILITOT 0.5  PROT 6.7  ALBUMIN 3.7    CBC:  Recent Labs Lab 08/27/12 0204 08/28/12 0547  WBC 7.2 7.4  HGB 17.6* 14.8  HCT 48.2 42.8  MCV 81.7 82.6  PLT 202 176    Cardiac Enzymes:  Recent Labs Lab 08/27/12 0848 08/27/12 1456 08/27/12 2051  TROPONINI 12.96* 17.74* 12.64*    Echocardiogram 8/16: Study Conclusions  Left ventricle: Akinesis apical-septal segment. Severe hypokinesis apical-lateral, apical-inferior, apical -anterior segments. Hypokinesis mid-anteroseptal segment. The cavity size was normal. Wall thickness was increased increased in a  pattern of mild to moderate LVH. The estimated ejection fraction was 45%.    Medications:   Scheduled Medications: . aspirin EC  81 mg Oral Daily  . atorvastatin  40 mg Oral q1800  . heparin  5,000 Units Subcutaneous Q8H  . metoprolol tartrate  25 mg Oral BID  . prasugrel  10 mg Oral Daily     Infusions: . sodium chloride 10 mL/hr (08/27/12 2313)  . nitroGLYCERIN 20 mcg/min (08/27/12 2000)     PRN Medications:  acetaminophen, nitroGLYCERIN, ondansetron (ZOFRAN) IV   Assessment:   1. NSTEMI status post intervention with DES to the mid LAD on 8/16. LVEF 45% by echocardiogram.  2. Limited chest CTA at presentation with questionable segmental pulmonary embolus and an area of streaking and motion artifact. At this point definitive diagnosis of pulmonary embolus is not suspected in light of clinical presentation and cardiac catheterization findings.  3. LDL 126, on Lipitor 40 mg daily.   Plan/Discussion:    Radial wrist band coming off slowly, no active bleeding now. Continue aspirin, Lipitor, and Effient. He is off nitroglycerine GTT. Change from Lopressor to Coreg with LV dysfunction. Up in chair today, but keep in unit. Doubt that the chest CTA equivocal abnormalities noted at presentation will need to be worked up further now, although can be considered if clinical situation changes.  Jonelle Sidle, M.D., F.A.C.C.

## 2012-08-28 NOTE — Progress Notes (Signed)
Right radial band continues to ooze with deflation; re -inflated to 12 cc and notified Dr. [place].  Dr. [place] in to see patient; placement of radial band in correct location, deflate at a significantly slower pace.

## 2012-08-29 LAB — BASIC METABOLIC PANEL
CO2: 25 mEq/L (ref 19–32)
Chloride: 103 mEq/L (ref 96–112)
Potassium: 3.7 mEq/L (ref 3.5–5.1)
Sodium: 137 mEq/L (ref 135–145)

## 2012-08-29 LAB — CBC
Hemoglobin: 15.7 g/dL (ref 13.0–17.0)
Platelets: 198 10*3/uL (ref 150–400)
RBC: 5.46 MIL/uL (ref 4.22–5.81)
WBC: 6.1 10*3/uL (ref 4.0–10.5)

## 2012-08-29 LAB — POCT ACTIVATED CLOTTING TIME: Activated Clotting Time: 391 seconds

## 2012-08-29 MED ORDER — RAMIPRIL 2.5 MG PO CAPS
2.5000 mg | ORAL_CAPSULE | Freq: Every day | ORAL | Status: DC
  Administered 2012-08-29 – 2012-08-30 (×2): 2.5 mg via ORAL
  Filled 2012-08-29 (×2): qty 1

## 2012-08-29 NOTE — Care Management Note (Signed)
    Page 1 of 1   08/29/2012     9:44:49 AM   CARE MANAGEMENT NOTE 08/29/2012  Patient:  ASHVIK, GRUNDMAN   Account Number:  [id number]  Date Initiated:  08/29/2012  Documentation initiated by:  Junius Creamer  Subjective/Objective Assessment:   adm w mi     Action/Plan:   lives w fam   Anticipated DC Date:     Anticipated DC Plan:        DC Planning Services  CM consult  Medication Assistance      Choice offered to / List presented to:             Status of service:   Medicare Important Message given?   (If response is "NO", the following Medicare IM given date fields will be blank) Date Medicare IM given:   Date Additional Medicare IM given:    Discharge Disposition:    Per UR Regulation:  Reviewed for med. necessity/level of care/duration of stay  If discussed at Long Length of Stay Meetings, dates discussed:    Comments:  8/18 0943 debbie Junious Ragone rn,bsn gave pt effient 30day free and copay assist card.

## 2012-08-29 NOTE — Progress Notes (Addendum)
Patient transferred to room 3W08 with belongongs, chart, and meds. Family accompanied patient to new room. Report given to receiving nurse, Baxter Hire.   Dawson Bills, RN

## 2012-08-29 NOTE — Progress Notes (Signed)
CARDIAC REHAB PHASE I   PRE:  Rate/Rhythm: 87SR  BP:  Supine: 128/79  Sitting:   Standing:    SaO2: 99%RA  MODE:  Ambulation: 390 ft   POST:  Rate/Rhythm: 89SR  BP:  Supine:   Sitting: 126/80  Standing:    SaO2: 99%RA 0910-1010 Pt had not walked in hall yet. Walked 390 ft with steady gait. Slightly lightheaded at end of walk but no CP. Tolerated well. Encouraged more walks with staff. To recliner with call bell. Education completed with pt and wife. Gave effient packet. Discussed CRP 2 and encouraged pt to ask new cardiologist in DC to refer to closest program.    Luetta Nutting, RN BSN  08/29/2012 10:06 AM

## 2012-08-29 NOTE — Progress Notes (Signed)
Patient ID: AN SCHNABEL MRN: [id number], DOB/AGE: 1970-12-22   Admit date: 08/27/2012 Date Today: 08/29/2012  Subjective: Patient has no complaints. Has no chest pain or shortness of breath. He did have some fatigue and dizziness with his first walk today.  Allergies  No Known Allergies  Inpatient Medications . aspirin EC  81 mg Oral Daily  . atorvastatin  40 mg Oral q1800  . carvedilol  6.25 mg Oral BID WC  . heparin  5,000 Units Subcutaneous Q8H  . prasugrel  10 mg Oral Daily   Physical Exam Blood pressure 121/60, pulse 78, temperature 98.8 F (37.1 C), temperature source Oral, resp. rate 17, height 5\' 10"  (1.778 m), weight 110.3 kg (243 lb 2.7 oz), SpO2 99.00%.  General: Pleasant, NAD Psych: Normal affect. Neuro: Alert and oriented X 3. Moves all extremities spontaneously. HEENT: Normal  Neck: Supple without bruits or JVD. Lungs:  Resp regular and unlabored, CTA. Heart: RRR no s3, s4, or murmurs. Abdomen: Soft, non-tender, non-distended, BS + x 4.  Extremities: No clubbing, cyanosis or edema. DP/PT/Radials 2+ and equal bilaterally. No hematoma, drainage, or erythema at drainage site.  Labs Recent Labs  08/27/12 0305 08/27/12 0848 08/27/12 1456 08/27/12 2051  TROPONINI 1.20* 12.96* 17.74* 12.64*   Lab Results  Component Value Date   WBC 6.1 08/29/2012   HGB 15.7 08/29/2012   HCT 45.3 08/29/2012   MCV 83.0 08/29/2012   PLT 198 08/29/2012    Recent Labs Lab 08/27/12 0848  08/29/12 0442  NA 135  < > 137  K 3.9  < > 3.7  CL 102  < > 103  CO2 23  < > 25  BUN 13  < > 13  CREATININE 1.13  < > 1.18  CALCIUM 8.6  < > 9.4  PROT 6.7  --   --   BILITOT 0.5  --   --   ALKPHOS 62  --   --   ALT 53  --   --   AST 99*  --   --   GLUCOSE 126*  < > 122*  < > = values in this interval not displayed. Lab Results  Component Value Date   CHOL 186 08/28/2012   HDL 35* 08/28/2012   LDLCALC 126* 08/28/2012   TRIG 126 08/28/2012   ECG - SR Telemetry:  I have reviewed  telemetry today August 29, 2012. There is normal sinus rhythm.  ASSESSMENT AND PLAN  NSTEMI with typical and atypical chest pain - patient recovering well from cath and LAD stent placement. Currently on Effient, Coreg, Lipitor, and aspirin. Place on nitroglycerin prn. The patient is improving. I am concerned that he has not ambulated much yet. I decided that it would be best to keep him in the hospital for an additional day. Also, he does have some left triple dysfunction from his ischemic event. I have decided to start a small dose of ACE inhibitor. 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   I will start a small dose of an ACE inhibitor today.  The patient lives in [place]. He is visiting and staying with a friend with his wife here in town. His plan is to stay in town for another few days after hospital discharge. I've asked his wife to try to contact her primary physician and arrange for a cardiologist in their home area. We  can then be sure that discharge information goes directly to his physician's.   Signed, SEALS, JAMIE, PA-S 08/29/2012, 7:47 AM  Theodore Demark, PA-C 08/29/2012 8:48 AM Beeper [phone] Patient seen and examined. I agree with the assessment and plan as detailed above. See also my additional thoughts below.   I've seen and examined the patient. I've spoken with the patient and his wife. I've made the decision that he will stay in the hospital another day. I agree with the note is written above. I have made adjustments to this note.  Willa Rough, MD, [facility] 08/29/2012 10:47 AM

## 2012-08-30 DIAGNOSIS — I2510 Atherosclerotic heart disease of native coronary artery without angina pectoris: Secondary | ICD-10-CM

## 2012-08-30 DIAGNOSIS — I255 Ischemic cardiomyopathy: Secondary | ICD-10-CM

## 2012-08-30 DIAGNOSIS — I2589 Other forms of chronic ischemic heart disease: Secondary | ICD-10-CM

## 2012-08-30 LAB — CBC
HCT: 45.5 % (ref 39.0–52.0)
Hemoglobin: 16 g/dL (ref 13.0–17.0)
RBC: 5.46 MIL/uL (ref 4.22–5.81)
RDW: 13.2 % (ref 11.5–15.5)
WBC: 5.8 10*3/uL (ref 4.0–10.5)

## 2012-08-30 MED ORDER — PRASUGREL HCL 10 MG PO TABS: 10.0000 mg | ORAL_TABLET | Freq: Every day | ORAL | Status: AC

## 2012-08-30 MED ORDER — CARVEDILOL 6.25 MG PO TABS: 6.2500 mg | ORAL_TABLET | Freq: Two times a day (BID) | ORAL | Status: DC

## 2012-08-30 MED ORDER — RAMIPRIL 2.5 MG PO CAPS: 2.5000 mg | ORAL_CAPSULE | Freq: Every day | ORAL | Status: DC

## 2012-08-30 MED ORDER — ATORVASTATIN CALCIUM 80 MG PO TABS: 80.0000 mg | ORAL_TABLET | Freq: Every day | ORAL | Status: DC

## 2012-08-30 MED ORDER — NITROGLYCERIN 0.4 MG SL SUBL: 0.4000 mg | SUBLINGUAL_TABLET | SUBLINGUAL | Status: AC | PRN

## 2012-08-30 MED ORDER — ASPIRIN 81 MG PO TBEC: 81.0000 mg | DELAYED_RELEASE_TABLET | Freq: Every day | ORAL | Status: AC

## 2012-08-30 NOTE — Progress Notes (Signed)
CARDIAC REHAB PHASE I   PRE:  Rate/Rhythm: 70SR  BP:  Supine:   Sitting: 100/80  Standing:    SaO2:   MODE:  Ambulation: 550 ft   POST:  Rate/Rhythm: 86 SR  BP:  Supine:   Sitting: 120/84  Standing:    SaO2:  0850-0908 Pt walked independently without CP. Tolerated well. No questions re ed.   Luetta Nutting, RN BSN  08/30/2012 9:05 AM

## 2012-08-30 NOTE — Discharge Summary (Signed)
Patient ID: Ian Martin,  MRN: [id number], DOB/AGE: 06/22/1970 42 y.o.  Admit date: 08/27/2012 Discharge date: 08/30/2012  Primary Care Provider: Trey Paula, MD - [place], [place] Primary Cardiologist: Pt will obtain a cardiologist in [place].  Discharge Diagnoses Principal Problem:   Non-STEMI (non-ST elevated myocardial infarction)  **s/p PCI/DES of the LAD this admission.  Active Problems:   CAD (coronary artery disease)   Ischemic Cardiomyopathy  **EF = 45% by echocardiography this admission.   Mixed hyperlipidemia  **LDL = 126 - statin initiated this admission.  Allergies No Known Allergies  Procedures  CTA of the Chest 8.16.2014  IMPRESSION:  1. Limited evaluation of the pulmonary artery secondary to contrast bolus timing. There is a questionable solitary segmental pulmonary embolus and a new right apical segmental pulmonary artery. However, this artery lies in a region of both streak and motion artifact. If clinically warranted, CT PE study could be repeated in 12-24 hours. Alternately, the bilateral lower extremity DVT ultrasound could be considered.  2. Cardiomegaly with left ventricular enlargement. _____________  Cardiac Catheterization and Percutaneous Coronary Intervention 8.16.2014  PROCEDURAL FINDINGS Hemodynamics: AO 113/82 mean 95 mm Hg LV 115/11 mm Hg              Coronary angiography: Coronary dominance: right  Left mainstem: Normal  Left anterior descending (LAD):  90% mid vessel stenosis. Large vessel. The first diagonal has mild disease less than 20%.   **The mid-LAD was successfully stented using 3.5 x 24 mm Promus Premier drug-eluting stent.**  Ramus intermediate with less than 20% disease. Left circumflex (LCx): Gives rise to 2 OM branches. Mild irregularities less than 10%. Right coronary artery (RCA): Large dominant vessel with diffuse mild disease up to 20%. Left ventriculography: Left ventricular systolic function is abnormal, LVEF  is estimated at 45-50%, there is anterior wall hypokinesis with focal apical dyskinesis, there is no significant mitral regurgitation   Final Conclusions:   1. Single vessel obstructive CAD 2. Mild LV dysfunction. 3. Successful stenting of the mid LAD with a DES.   Recommendations:  Dual antiplatelet therapy for one year. Risk factor modification. _____________   2D Echocardiogram 8.16.2014  Study Conclusions  Left ventricle: Akinesis apical-septal segment. Severe hypokinesis apical-lateral, apical-inferior, apical -anterior segments. Hypokinesis mid-anteroseptal segment. The cavity size was normal. Wall thickness was increased increased in a pattern of mild to moderate LVH. The estimated ejection fraction was 45%. _____________   History of Present Illness  42 y/o Ian Martin male without prior cardiac history.  He lives in [place] but is currently visiting with family in the [place] area.  He was in his usual state of health until 2 days prior to admission when he had an episode of chest pain and diaphoresis, which lasted about 4 hours.  On the evening prior to admission, he developed recurrent chest pain and diaphoresis associated with palpitations and presyncope.  His wife drove him into the [facility] ED where ECG showed poor R wave progression and inferior Q's.  He was also noted to have intermittent NSVT and AIVR on telemetry.  Troponin was elevated @ 1.20.  CT angiography was performed secondary to concern for PE in the setting [place]  This showed streak and motion artifact with question of PE.  Patient was placed on heparin and admitted for further evaluation.  Hospital Course  Patient ruled in for NSTEMI, eventually peaking his troponin @ 17.74.  He continued to have mild chest discomfort and f/u ECG  on 8/16 showed new T wave abnormalities in anteroseptal leads.  As a result, decision was made to pursue urgent diagnostic catheterization.   This was carried out on 8/16 and revealed a 90% stenosis within the mid LAD.  He otherwise had nonobstructive CAD and an EF of 45-50%.  The LAD was successfully treated using a 3.5 x 24 mm Promus Premier DES.  Patient tolerated procedure well and post-procedure was monitored in the coronary intensive care unit.  He was placed on beta blocker, ace inhibitor, aspirin, effient, and high potency statin therapy.  He had no further chest discomfort.  2D echo was performed on 8/16 and showed an EF of 45% with mild to moderate LVH.  He has remained euvolemic throughout his hospitalization.  In light of CAD in the setting of NSTEMI, with normal RV function on echocardiography, it was not felt that further evaluation for PE or repeat CT scanning was necessary.  Mr. Ian Martin was transferred out to the floor on 8/18, and was seen by inpatient cardiac rehab.  He has been ambulating without difficulty or recurrent symptoms and will be discharged home today in good condition.  As the patient lives in [place], we have provided him with two sets of prescriptions, one to fill locally, prior to his return to [place], and one to fill at home.  He plans to f/u with his PCP within the next week and will seek cardiology referral in [place] at that point.  Discharge Vitals Blood pressure 136/87, pulse 70, temperature 98.5 F (36.9 C), temperature source Oral, resp. rate 18, height 5\' 10"  (1.778 m), weight 243 lb 2.7 oz (110.3 kg), SpO2 100.00%.  Filed Weights   08/27/12 0815 08/27/12 0900 08/29/12 0600  Weight: 250 lb (113.399 kg) 250 lb (113.399 kg) 243 lb 2.7 oz (110.3 kg)   Labs  CBC  Recent Labs  08/29/12 0442 08/30/12 0552  WBC 6.1 5.8  HGB 15.7 16.0  HCT 45.3 45.5  MCV 83.0 83.3  PLT 198 215   Basic Metabolic Panel  Recent Labs  08/28/12 0547 08/29/12 0442  NA 136 137  K 3.8 3.7  CL 103 103  CO2 24 25  GLUCOSE 110* 122*  BUN 9 13  CREATININE 1.20 1.18  CALCIUM 8.9 9.4   Liver Function Tests  Recent  Labs  08/27/12 0848  AST 99*  ALT 53  ALKPHOS 62  BILITOT 0.5  PROT 6.7  ALBUMIN 3.7   Cardiac Enzymes  Recent Labs  08/27/12 0848 08/27/12 1456 08/27/12 2051  TROPONINI 12.96* 17.74* 12.64*   Hemoglobin A1C  Recent Labs  08/27/12 0848  HGBA1C 5.7*   Fasting Lipid Panel  Recent Labs  08/28/12 0547  CHOL 186  HDL 35*  LDLCALC 126*  TRIG 126  CHOLHDL 5.3   Thyroid Function Tests  Recent Labs  08/27/12 0848  TSH 3.061   Disposition  Pt is being discharged home today in good condition.  Follow-up Plans & Appointments  Follow-up Information   Follow up with Dr. Trey Martin. (1-2 wks.)    Contact information:    [address] [place], [place] [address]     Discharge Medications    Medication List         aspirin 81 MG EC tablet  Take 1 tablet (81 mg total) by mouth daily.     atorvastatin 80 MG tablet  Commonly known as:  LIPITOR  Take 1 tablet (80 mg total) by mouth daily at 6 PM.  carvedilol 6.25 MG tablet  Commonly known as:  COREG  Take 1 tablet (6.25 mg total) by mouth 2 (two) times daily with a meal.     nitroGLYCERIN 0.4 MG SL tablet  Commonly known as:  NITROSTAT  Place 1 tablet (0.4 mg total) under the tongue every 5 (five) minutes x 3 doses as needed for chest pain.     prasugrel 10 MG Tabs tablet  Commonly known as:  EFFIENT  Take 1 tablet (10 mg total) by mouth daily.     ramipril 2.5 MG capsule  Commonly known as:  ALTACE  Take 1 capsule (2.5 mg total) by mouth daily.      Outstanding Labs/Studies  Patient will require f/u lipids and lft's in 8 wks.  Duration of Discharge Encounter   Greater than 30 minutes including physician time.  Signed, Nicolasa Ducking NP 08/30/2012, 8:27 AM   Patient seen and examined and history reviewed. Agree with above findings and plan. See earlier rounding note.  [name] [facility] 08/30/2012 10:15 AM

## 2012-08-30 NOTE — Progress Notes (Signed)
Patient Name: Ian Martin Date of Encounter: 08/30/2012   Principal Problem:   Non-STEMI (non-ST elevated myocardial infarction) Active Problems:   CAD (coronary artery disease)   Cardiomyopathy, ischemic   Mixed hyperlipidemia   SUBJECTIVE  No chest pain or sob overnight.  Ambulated yesterday without difficulty.  Eager to go home.  CURRENT MEDS . aspirin EC  81 mg Oral Daily  . atorvastatin  40 mg Oral q1800  . carvedilol  6.25 mg Oral BID WC  . heparin  5,000 Units Subcutaneous Q8H  . prasugrel  10 mg Oral Daily  . ramipril  2.5 mg Oral Daily   OBJECTIVE  Filed Vitals:   08/29/12 1725 08/29/12 1813 08/29/12 2053 08/30/12 0532  BP: 133/83 124/80 118/79 136/87  Pulse:  75 68 70  Temp:  98.9 F (37.2 C) 98.8 F (37.1 C) 98.5 F (36.9 C)  TempSrc:  Oral Oral Oral  Resp: 28 20 18 18   Height:      Weight:      SpO2: 100% 99% 100% 100%    Intake/Output Summary (Last 24 hours) at 08/30/12 0830 Last data filed at 08/29/12 2342  Gross per 24 hour  Intake    720 ml  Output      0 ml  Net    720 ml   Filed Weights   08/27/12 0815 08/27/12 0900 08/29/12 0600  Weight: 250 lb (113.399 kg) 250 lb (113.399 kg) 243 lb 2.7 oz (110.3 kg)   PHYSICAL EXAM  General: Pleasant, NAD. Neuro: Alert and oriented X 3. Moves all extremities spontaneously. Psych: Normal affect. HEENT:  Normal  Neck: Supple without bruits or JVD. Lungs:  Resp regular and unlabored, CTA. Heart: RRR no s3, s4, or murmurs. Abdomen: Soft, non-tender, non-distended, BS + x 4.  Extremities: No clubbing, cyanosis or edema. DP/PT/Radials 2+ and equal bilaterally.  R wrist cath site w/o bleeding/bruits/hematoma.  Accessory Clinical Findings  CBC  Recent Labs  08/29/12 0442 08/30/12 0552  WBC 6.1 5.8  HGB 15.7 16.0  HCT 45.3 45.5  MCV 83.0 83.3  PLT 198 215   Basic Metabolic Panel  Recent Labs  08/28/12 0547 08/29/12 0442  NA 136 137  K 3.8 3.7  CL 103 103  CO2 24 25  GLUCOSE 110*  122*  BUN 9 13  CREATININE 1.20 1.18  CALCIUM 8.9 9.4   Liver Function Tests  Recent Labs  08/27/12 0848  AST 99*  ALT 53  ALKPHOS 62  BILITOT 0.5  PROT 6.7  ALBUMIN 3.7   Cardiac Enzymes  Recent Labs  08/27/12 0848 08/27/12 1456 08/27/12 2051  TROPONINI 12.96* 17.74* 12.64*   Hemoglobin A1C  Recent Labs  08/27/12 0848  HGBA1C 5.7*   Fasting Lipid Panel  Recent Labs  08/28/12 0547  CHOL 186  HDL 35*  LDLCALC 126*  TRIG 126  CHOLHDL 5.3   Thyroid Function Tests  Recent Labs  08/27/12 0848  TSH 3.061   TELE  rsr  ASSESSMENT AND PLAN  1.  Acute NSTEMI/CAD:  S/p PCI/DES of the LAD on 8/16.  No recurrent chest pain.  Ambulated with cardiac rehab yesterday and feels well.  Cont asa, bb, acei, effient, statin.  Will provide 2 sets of scrips as he lives in [place] and plans to f/u with PCP there for cardiology referral locally.  We discussed the importance of having meds filled here, in [place], today and he and his wife are on board and understand fully.  Spent ~  20 mins discussing his hospitalization, pathophysiology of CAD and ICM, med Rx, risk factor modification, f/u, and prognosis.  2.  ICM:  euvolemic on exam.  Cont bb and acei.  EF 45% by echo.  3.  HL:  LDL 126.  Cont high potency statin.  Signed, Nicolasa Ducking NP Patient seen and examined and history reviewed. Agree with above findings and plan. I reviewed medications with him. Also discussed recommendations for driving, cardiac Rehab, aerobic exercise and diet. Will plan on DC home today with return to [place] in next day or two.  [name] [facility] 08/30/2012 10:14 AM

## 2012-08-31 ENCOUNTER — Telehealth: Payer: Self-pay | Admitting: Cardiology

## 2012-08-31 NOTE — Telephone Encounter (Signed)
Pt Came In office 8/19 and dropped Off FMLA,Sickness Claim Form,Attending Physicians Statement He stated Dr.Jordan sent him over here From Hospital he was released 8/19 and his paperwork would get Completed On site. I spoke with Elnita Maxwell and she stated she Knows nothing about paperwork and it would be best of [facility]  Completed PW. I called Pt back this am explained the Process of [employer],signing ROI,and Pmt he Ok'd. Called  Carolyn/HP and Let her Know Pt was coming By to sign ROI & Make Pmt.

## 2012-09-08 ENCOUNTER — Telehealth: Payer: Self-pay | Admitting: Cardiology

## 2012-09-08 NOTE — Telephone Encounter (Signed)
All forms were faxed to the appropriate locations Spoke with the patient. Mailed originals to the patient and retained copies for [employer]. To  scan into patient's chart/djc

## 2012-09-19 ENCOUNTER — Telehealth: Payer: Self-pay | Admitting: Cardiology

## 2012-09-19 NOTE — Telephone Encounter (Signed)
Pt called in stating the [facility] papers the back page was not completed by Dr.Jordan A copy was received Via Fax took to [place] P for her to Keep until Dr.Jordan returns in Office 09/19/12/KM

## 2012-09-22 ENCOUNTER — Inpatient Hospital Stay
Admission: RE | Admit: 2012-09-22 | Discharge: 2012-09-22 | Disposition: A | Discharge status: Home / Self Care | Payer: BC Managed Care – PPO | Source: Ambulatory Visit | Attending: Internal Medicine | Admitting: Internal Medicine

## 2012-09-22 VITALS — BP 126/82 | Ht 69.0 in | Wt 251.4 lb

## 2012-09-22 DIAGNOSIS — Z9861 Coronary angioplasty status: Secondary | ICD-10-CM | POA: Insufficient documentation

## 2012-09-22 DIAGNOSIS — I219 Acute myocardial infarction, unspecified: Secondary | ICD-10-CM | POA: Insufficient documentation

## 2012-09-22 HISTORY — DX: Hyperlipidemia, unspecified: E78.5

## 2012-09-22 NOTE — Progress Notes (Signed)
Phase II- patient completed exercise today w/o symptoms.Will progress with duration  and intensity as tolerated.See scanned  QTel report for ECG analysis,workloads and heart rate data.   First session in Cardiac rehab. Assessment completed and B/P = both arms. No apparent distress noted. Denies any pain or problems/ DES score 19 will f/u with his Cardiologist regarding depression score.  First exercise session completed without difficulty. All safety precautions reviewed with patient with return demonstration.

## 2012-09-23 ENCOUNTER — Inpatient Hospital Stay
Admission: RE | Admit: 2012-09-23 | Discharge: 2012-09-23 | Disposition: A | Discharge status: Home / Self Care | Payer: BC Managed Care – PPO | Source: Ambulatory Visit

## 2012-09-23 VITALS — BP 120/78 | Wt 248.0 lb

## 2012-09-23 DIAGNOSIS — Z9861 Coronary angioplasty status: Secondary | ICD-10-CM

## 2012-09-23 NOTE — Progress Notes (Signed)
Phase II: Patient completed exercise today with no s/s. Progression principle of duration and intensity will be applied. See scanned Q-Tel session report for ECG analysis, workloads and heart rate data.  Reinforced safety and proper use of equipment.

## 2012-09-26 ENCOUNTER — Ambulatory Visit: Payer: BC Managed Care – PPO

## 2012-09-28 ENCOUNTER — Ambulatory Visit: Payer: BC Managed Care – PPO

## 2012-09-28 ENCOUNTER — Inpatient Hospital Stay
Admission: RE | Admit: 2012-09-28 | Discharge: 2012-09-28 | Disposition: A | Discharge status: Home / Self Care | Payer: BC Managed Care – PPO | Source: Ambulatory Visit

## 2012-09-28 VITALS — BP 106/72 | Wt 246.0 lb

## 2012-09-28 DIAGNOSIS — Z9861 Coronary angioplasty status: Secondary | ICD-10-CM

## 2012-09-28 DIAGNOSIS — I219 Acute myocardial infarction, unspecified: Secondary | ICD-10-CM

## 2012-09-28 NOTE — Progress Notes (Signed)
Phase II- patient completed exercise today w/o symptoms.Will progress with duration  and intensity as tolerated.See scanned  QTel report for ECG analysis,workloads and heart rate data.

## 2012-09-30 ENCOUNTER — Ambulatory Visit: Payer: BC Managed Care – PPO

## 2012-09-30 ENCOUNTER — Inpatient Hospital Stay
Admission: RE | Admit: 2012-09-30 | Discharge: 2012-09-30 | Disposition: A | Discharge status: Home / Self Care | Payer: BC Managed Care – PPO | Source: Ambulatory Visit

## 2012-09-30 VITALS — BP 114/70 | Wt 248.0 lb

## 2012-09-30 NOTE — Progress Notes (Signed)
Phase II: Patient completed exercise today with no s/s. Progression principle of duration and intensity will be applied. Continuous ECG telemetry monitoring throughout session. See scanned Q-Tel session report for ECG analysis, workloads and heart rate data.

## 2012-10-03 ENCOUNTER — Inpatient Hospital Stay
Admission: RE | Admit: 2012-10-03 | Discharge: 2012-10-03 | Disposition: A | Discharge status: Home / Self Care | Payer: BC Managed Care – PPO | Source: Ambulatory Visit

## 2012-10-03 ENCOUNTER — Ambulatory Visit: Payer: BC Managed Care – PPO

## 2012-10-03 VITALS — BP 110/72 | Wt 250.0 lb

## 2012-10-03 NOTE — Progress Notes (Signed)
Phase II- patient completed continous EKG monitoring  today w/o symptoms.Will progress with duration  and intensity as tolerated.See scanned  QTel report for ECG analysis,workloads and heart rate data.

## 2012-10-05 ENCOUNTER — Ambulatory Visit: Payer: BC Managed Care – PPO

## 2012-10-05 ENCOUNTER — Inpatient Hospital Stay
Admission: RE | Admit: 2012-10-05 | Discharge: 2012-10-05 | Disposition: A | Discharge status: Home / Self Care | Payer: BC Managed Care – PPO | Source: Ambulatory Visit

## 2012-10-05 VITALS — BP 108/62 | Wt 247.0 lb

## 2012-10-05 NOTE — Progress Notes (Signed)
Phase II- patient completed continous EKG monitoring  today w/o symptoms.Will progress with duration  and intensity as tolerated.See scanned  QTel report for ECG analysis,workloads and heart rate data.

## 2012-10-07 ENCOUNTER — Inpatient Hospital Stay
Admission: RE | Admit: 2012-10-07 | Discharge: 2012-10-07 | Disposition: A | Discharge status: Home / Self Care | Payer: BC Managed Care – PPO | Source: Ambulatory Visit

## 2012-10-07 ENCOUNTER — Ambulatory Visit: Payer: BC Managed Care – PPO

## 2012-10-07 VITALS — BP 112/76 | Wt 245.0 lb

## 2012-10-07 NOTE — Progress Notes (Signed)
Phase II: Patient completed exercise today with no s/s. Progression principle of duration and intensity will be applied. Continuous ECG telemetry monitoring throughout session. See scanned Q-Tel session report for ECG analysis, workloads and heart rate data.

## 2012-10-10 ENCOUNTER — Inpatient Hospital Stay
Admission: RE | Admit: 2012-10-10 | Discharge: 2012-10-10 | Disposition: A | Discharge status: Home / Self Care | Payer: BC Managed Care – PPO | Source: Ambulatory Visit

## 2012-10-10 ENCOUNTER — Ambulatory Visit: Payer: BC Managed Care – PPO

## 2012-10-10 VITALS — BP 118/74 | Wt 242.0 lb

## 2012-10-10 NOTE — Progress Notes (Signed)
Phase II: Patient completed exercise today with no s/s. Progression principle of duration and intensity will be applied. Continuous ECG telemetry monitoring throughout session. See scanned Q-Tel session report for ECG analysis, workloads and heart rate data.

## 2012-10-12 ENCOUNTER — Ambulatory Visit: Payer: BC Managed Care – PPO

## 2012-10-12 ENCOUNTER — Inpatient Hospital Stay
Admission: RE | Admit: 2012-10-12 | Discharge: 2012-10-12 | Disposition: A | Discharge status: Home / Self Care | Payer: BC Managed Care – PPO | Source: Ambulatory Visit | Attending: Internal Medicine | Admitting: Internal Medicine

## 2012-10-12 VITALS — BP 100/66 | Wt 243.0 lb

## 2012-10-12 DIAGNOSIS — I219 Acute myocardial infarction, unspecified: Secondary | ICD-10-CM | POA: Insufficient documentation

## 2012-10-12 DIAGNOSIS — Z9861 Coronary angioplasty status: Secondary | ICD-10-CM | POA: Insufficient documentation

## 2012-10-12 NOTE — Progress Notes (Signed)
Phase II- patient completed continous EKG monitoring  today w/o symptoms.Will progress with duration  and intensity as tolerated.See scanned  QTel report for ECG analysis,workloads and heart rate data.

## 2012-10-13 NOTE — Progress Notes (Signed)
Reviewed and in agreement with cardiac rehab assessment and individualized treatment plan.

## 2012-10-14 ENCOUNTER — Inpatient Hospital Stay
Admission: RE | Admit: 2012-10-14 | Discharge: 2012-10-14 | Disposition: A | Discharge status: Home / Self Care | Payer: BC Managed Care – PPO | Source: Ambulatory Visit

## 2012-10-14 ENCOUNTER — Ambulatory Visit: Payer: BC Managed Care – PPO

## 2012-10-14 VITALS — BP 98/68 | Wt 244.0 lb

## 2012-10-14 NOTE — Progress Notes (Signed)
Phase II: Patient completed exercise today with no s/s. Progression principle of duration and intensity will be applied. Continuous ECG telemetry monitoring throughout session. See scanned Q-Tel session report for ECG analysis, workloads and heart rate data.

## 2012-10-17 ENCOUNTER — Ambulatory Visit: Payer: BC Managed Care – PPO

## 2012-10-17 ENCOUNTER — Inpatient Hospital Stay
Admission: RE | Admit: 2012-10-17 | Discharge: 2012-10-17 | Disposition: A | Discharge status: Home / Self Care | Payer: BC Managed Care – PPO | Source: Ambulatory Visit

## 2012-10-17 VITALS — BP 108/60 | Wt 244.0 lb

## 2012-10-17 NOTE — Progress Notes (Signed)
Phase II: Patient completed exercise today with no s/s. Progression principle of duration and intensity will be applied. Continuous ECG telemetry monitoring throughout session. See scanned Q-Tel session report for ECG analysis, workloads and heart rate data.

## 2012-10-18 ENCOUNTER — Other Ambulatory Visit: Payer: Self-pay

## 2012-10-18 MED ORDER — ATORVASTATIN CALCIUM 80 MG PO TABS: 80.0000 mg | ORAL_TABLET | Freq: Every day | ORAL | Status: DC

## 2012-10-18 MED ORDER — CARVEDILOL 6.25 MG PO TABS: 6.2500 mg | ORAL_TABLET | Freq: Two times a day (BID) | ORAL | Status: DC

## 2012-10-18 MED ORDER — RAMIPRIL 2.5 MG PO CAPS: 2.5000 mg | ORAL_CAPSULE | Freq: Every day | ORAL | Status: DC

## 2012-10-19 ENCOUNTER — Inpatient Hospital Stay
Admission: RE | Admit: 2012-10-19 | Discharge: 2012-10-19 | Disposition: A | Discharge status: Home / Self Care | Payer: BC Managed Care – PPO | Source: Ambulatory Visit

## 2012-10-19 ENCOUNTER — Ambulatory Visit: Payer: BC Managed Care – PPO

## 2012-10-19 VITALS — BP 112/80 | Ht 69.02 in | Wt 243.7 lb

## 2012-10-19 DIAGNOSIS — I219 Acute myocardial infarction, unspecified: Secondary | ICD-10-CM

## 2012-10-19 DIAGNOSIS — Z9861 Coronary angioplasty status: Secondary | ICD-10-CM

## 2012-10-19 NOTE — Progress Notes (Signed)
Phase II: Patient completed exercise today with no s/s. Progression principle of duration and intensity will be applied. Continuous ECG telemetry monitoring throughout session. See scanned Q-Tel session report for ECG analysis, workloads and heart rate data.

## 2012-10-20 NOTE — Addendum Note (Signed)
Encounter addended by: Meliton Rattan, RN on: 10/20/2012 11:05 AM<BR>     Documentation filed: Follow-up Section, Inpatient Document Flowsheet

## 2012-10-21 ENCOUNTER — Ambulatory Visit: Payer: BC Managed Care – PPO

## 2012-10-24 ENCOUNTER — Ambulatory Visit: Payer: BC Managed Care – PPO

## 2012-10-24 ENCOUNTER — Inpatient Hospital Stay
Admission: RE | Admit: 2012-10-24 | Discharge: 2012-10-24 | Disposition: A | Discharge status: Home / Self Care | Payer: BC Managed Care – PPO | Source: Ambulatory Visit

## 2012-10-24 VITALS — BP 108/76 | Wt 247.0 lb

## 2012-10-24 NOTE — Progress Notes (Signed)
Phase II: Patient completed exercise today with no s/s. Progression principle of duration and intensity will be applied. Continuous ECG telemetry monitoring throughout session. See scanned Q-Tel session report for ECG analysis, workloads and heart rate data.

## 2012-10-26 ENCOUNTER — Inpatient Hospital Stay
Admission: RE | Admit: 2012-10-26 | Discharge: 2012-10-26 | Disposition: A | Discharge status: Home / Self Care | Payer: BC Managed Care – PPO | Source: Ambulatory Visit

## 2012-10-26 ENCOUNTER — Ambulatory Visit: Payer: BC Managed Care – PPO

## 2012-10-26 VITALS — BP 116/78 | Wt 245.1 lb

## 2012-10-26 NOTE — Progress Notes (Signed)
Phase II- patient completed continous EKG monitoring  today w/o symptoms.Will progress with duration  and intensity as tolerated.See scanned  QTel report for ECG analysis,workloads and heart rate data.

## 2012-10-28 ENCOUNTER — Inpatient Hospital Stay
Admission: RE | Admit: 2012-10-28 | Discharge: 2012-10-28 | Disposition: A | Discharge status: Home / Self Care | Payer: BC Managed Care – PPO | Source: Ambulatory Visit

## 2012-10-28 ENCOUNTER — Ambulatory Visit: Payer: BC Managed Care – PPO

## 2012-10-28 VITALS — BP 112/80 | Wt 243.5 lb

## 2012-10-28 NOTE — Progress Notes (Signed)
Phase II- patient completed continous EKG monitoring  today w/o symptoms.Will progress with duration  and intensity as tolerated.See scanned  QTel report for ECG analysis,workloads and heart rate data.

## 2012-10-31 ENCOUNTER — Inpatient Hospital Stay
Admission: RE | Admit: 2012-10-31 | Discharge: 2012-10-31 | Disposition: A | Discharge status: Home / Self Care | Payer: BC Managed Care – PPO | Source: Ambulatory Visit

## 2012-10-31 ENCOUNTER — Ambulatory Visit: Payer: BC Managed Care – PPO

## 2012-10-31 VITALS — BP 122/82 | Wt 245.0 lb

## 2012-10-31 NOTE — Progress Notes (Signed)
Tolerated exercise well today with no signs or symptoms.  Continuous ECG telemetry monitoring throughout session. Duration and work load intensity increased as tolerated.

## 2012-11-02 ENCOUNTER — Ambulatory Visit: Payer: BC Managed Care – PPO

## 2012-11-02 ENCOUNTER — Inpatient Hospital Stay
Admission: RE | Admit: 2012-11-02 | Discharge: 2012-11-02 | Disposition: A | Discharge status: Home / Self Care | Payer: BC Managed Care – PPO | Source: Ambulatory Visit

## 2012-11-02 VITALS — BP 100/70 | Wt 245.0 lb

## 2012-11-02 DIAGNOSIS — I219 Acute myocardial infarction, unspecified: Secondary | ICD-10-CM

## 2012-11-02 NOTE — Progress Notes (Signed)
Phase II- patient completed continous EKG monitoring  today w/o symptoms.Will progress with duration  and intensity as tolerated.See scanned  QTel report for ECG analysis,workloads and heart rate data.

## 2012-11-02 NOTE — Progress Notes (Signed)
Pt instructed on a 1700 calorie weight reduction low sat fat low cholesterol diet. Expect compliance.

## 2012-11-04 ENCOUNTER — Ambulatory Visit: Payer: BC Managed Care – PPO

## 2012-11-04 ENCOUNTER — Inpatient Hospital Stay
Admission: RE | Admit: 2012-11-04 | Discharge: 2012-11-04 | Disposition: A | Discharge status: Home / Self Care | Payer: BC Managed Care – PPO | Source: Ambulatory Visit

## 2012-11-04 VITALS — BP 124/84 | Wt 241.0 lb

## 2012-11-04 NOTE — Progress Notes (Signed)
Tolerated exercise well today with no signs or symptoms.  Continuous ECG telemetry monitoring throughout session. Duration and work load intensity increased as tolerated.

## 2012-11-04 NOTE — Addendum Note (Signed)
Encounter addended by: Ardith Dark, RN on: 11/04/2012 12:24 PM<BR>     Documentation filed: Charges VN

## 2012-11-07 ENCOUNTER — Ambulatory Visit: Payer: BC Managed Care – PPO

## 2012-11-07 ENCOUNTER — Inpatient Hospital Stay
Admission: RE | Admit: 2012-11-07 | Discharge: 2012-11-07 | Disposition: A | Discharge status: Home / Self Care | Payer: BC Managed Care – PPO | Source: Ambulatory Visit

## 2012-11-07 VITALS — BP 122/74 | Wt 243.7 lb

## 2012-11-07 DIAGNOSIS — Z9861 Coronary angioplasty status: Secondary | ICD-10-CM

## 2012-11-07 DIAGNOSIS — I219 Acute myocardial infarction, unspecified: Secondary | ICD-10-CM

## 2012-11-07 NOTE — Progress Notes (Signed)
Phase II- patient completed continous EKG monitoring  today w/o symptoms.Will progress with duration  and intensity as tolerated.See scanned  QTel report for ECG analysis,workloads and heart rate data.

## 2012-11-09 ENCOUNTER — Inpatient Hospital Stay
Admission: RE | Admit: 2012-11-09 | Discharge: 2012-11-09 | Disposition: A | Discharge status: Home / Self Care | Payer: BC Managed Care – PPO | Source: Ambulatory Visit

## 2012-11-09 ENCOUNTER — Ambulatory Visit: Payer: BC Managed Care – PPO

## 2012-11-09 VITALS — BP 112/68 | Wt 246.0 lb

## 2012-11-09 NOTE — Progress Notes (Signed)
Phase II: Patient completed exercise today with no s/s. Progression principle of duration and intensity will be applied. Continuous ECG telemetry monitoring throughout session. See scanned Q-Tel session report for ECG analysis, workloads and heart rate data.

## 2012-11-11 ENCOUNTER — Inpatient Hospital Stay
Admission: RE | Admit: 2012-11-11 | Discharge: 2012-11-11 | Disposition: A | Discharge status: Home / Self Care | Payer: BC Managed Care – PPO | Source: Ambulatory Visit

## 2012-11-11 ENCOUNTER — Ambulatory Visit: Payer: BC Managed Care – PPO

## 2012-11-11 VITALS — BP 102/68 | Wt 245.3 lb

## 2012-11-11 NOTE — Progress Notes (Signed)
Phase II- patient completed continous EKG monitoring  today w/o symptoms.Will progress with duration  and intensity as tolerated.See scanned  QTel report for ECG analysis,workloads and heart rate data.

## 2012-11-14 ENCOUNTER — Inpatient Hospital Stay
Admission: RE | Admit: 2012-11-14 | Discharge: 2012-11-14 | Disposition: A | Discharge status: Home / Self Care | Payer: BC Managed Care – PPO | Source: Ambulatory Visit | Attending: Internal Medicine | Admitting: Internal Medicine

## 2012-11-14 ENCOUNTER — Ambulatory Visit: Payer: BC Managed Care – PPO

## 2012-11-14 VITALS — BP 122/80 | Wt 245.3 lb

## 2012-11-14 DIAGNOSIS — I219 Acute myocardial infarction, unspecified: Secondary | ICD-10-CM | POA: Insufficient documentation

## 2012-11-14 DIAGNOSIS — Z9861 Coronary angioplasty status: Secondary | ICD-10-CM | POA: Insufficient documentation

## 2012-11-14 NOTE — Progress Notes (Signed)
Phase II: Patient completed exercise today with no s/s. Progression principle of duration and intensity will be applied. Continuous ECG telemetry monitoring throughout session. See scanned Q-Tel session report for ECG analysis, workloads and heart rate data.

## 2012-11-16 ENCOUNTER — Ambulatory Visit: Payer: BC Managed Care – PPO

## 2012-11-16 ENCOUNTER — Inpatient Hospital Stay
Admission: RE | Admit: 2012-11-16 | Discharge: 2012-11-16 | Disposition: A | Discharge status: Home / Self Care | Payer: BC Managed Care – PPO | Source: Ambulatory Visit

## 2012-11-16 VITALS — BP 102/78 | Ht 69.02 in | Wt 243.7 lb

## 2012-11-16 DIAGNOSIS — I219 Acute myocardial infarction, unspecified: Secondary | ICD-10-CM

## 2012-11-16 DIAGNOSIS — Z9861 Coronary angioplasty status: Secondary | ICD-10-CM

## 2012-11-16 NOTE — Addendum Note (Signed)
Encounter addended by: Pincus Sanes on: 11/16/2012  8:27 AM<BR>     Documentation filed: Inpatient Document Flowsheet

## 2012-11-16 NOTE — Progress Notes (Signed)
Phase II: Patient completed exercise today with no s/s. In accordance with the individual exercise prescription, progression principle of duration and intensity will be applied when a steady state of HR/RPE occurs. Continuous ECG telemetry monitoring throughout session. See scanned Q-Tel session report for ECG analysis, workloads and heart rate data.

## 2012-11-17 ENCOUNTER — Other Ambulatory Visit: Payer: Self-pay

## 2012-11-18 ENCOUNTER — Ambulatory Visit: Payer: BC Managed Care – PPO

## 2012-11-18 ENCOUNTER — Inpatient Hospital Stay
Admission: RE | Admit: 2012-11-18 | Discharge: 2012-11-18 | Disposition: A | Discharge status: Home / Self Care | Payer: BC Managed Care – PPO | Source: Ambulatory Visit

## 2012-11-18 VITALS — BP 112/80 | Wt 240.0 lb

## 2012-11-18 NOTE — Progress Notes (Signed)
Phase II: Patient completed exercise today with no s/s. In accordance with the individual exercise prescription, progression principle of duration and intensity will be applied when a steady state of HR/RPE occurs. Continuous ECG telemetry monitoring throughout session. See scanned Q-Tel session report for ECG analysis, workloads and heart rate data.

## 2012-11-21 ENCOUNTER — Ambulatory Visit: Payer: BC Managed Care – PPO

## 2012-11-21 ENCOUNTER — Inpatient Hospital Stay
Admission: RE | Admit: 2012-11-21 | Discharge: 2012-11-21 | Disposition: A | Discharge status: Home / Self Care | Payer: BC Managed Care – PPO | Source: Ambulatory Visit

## 2012-11-21 VITALS — BP 118/80 | Wt 245.1 lb

## 2012-11-21 NOTE — Addendum Note (Signed)
Encounter addended by: Meliton Rattan, RN on: 11/21/2012  9:59 AM<BR>     Documentation filed: Inpatient Document Flowsheet

## 2012-11-21 NOTE — Progress Notes (Signed)
Phase II- patient completed continous EKG monitoring  today w/o symptoms.Will progress with duration  and intensity as tolerated.See scanned  QTel report for ECG analysis,workloads and heart rate data.

## 2012-11-23 ENCOUNTER — Inpatient Hospital Stay
Admission: RE | Admit: 2012-11-23 | Discharge: 2012-11-23 | Disposition: A | Discharge status: Home / Self Care | Payer: BC Managed Care – PPO | Source: Ambulatory Visit

## 2012-11-23 ENCOUNTER — Ambulatory Visit: Payer: BC Managed Care – PPO

## 2012-11-23 VITALS — BP 112/78 | Wt 242.7 lb

## 2012-11-23 NOTE — Progress Notes (Signed)
Phase II: Patient completed exercise today with no s/s. In accordance with the individual exercise prescription, progression principle of duration and intensity will be applied when a steady state of HR/RPE occurs. Continuous ECG telemetry monitoring throughout session. See scanned Q-Tel session report for ECG analysis, workloads and heart rate data.

## 2012-11-25 ENCOUNTER — Inpatient Hospital Stay
Admission: RE | Admit: 2012-11-25 | Discharge: 2012-11-25 | Disposition: A | Discharge status: Home / Self Care | Payer: BC Managed Care – PPO | Source: Ambulatory Visit

## 2012-11-25 ENCOUNTER — Ambulatory Visit: Payer: BC Managed Care – PPO

## 2012-11-25 VITALS — BP 134/82 | Wt 245.0 lb

## 2012-11-25 DIAGNOSIS — I219 Acute myocardial infarction, unspecified: Secondary | ICD-10-CM

## 2012-11-25 DIAGNOSIS — Z9861 Coronary angioplasty status: Secondary | ICD-10-CM

## 2012-11-25 NOTE — Progress Notes (Signed)
Phase II: Patient completed exercise today with no s/s. In accordance with the individual exercise prescription, progression principle of duration and intensity will be applied when a steady state of HR/RPE occurs. Continuous ECG telemetry monitoring throughout session. See scanned Q-Tel session report for ECG analysis, workloads and heart rate data.

## 2012-11-28 ENCOUNTER — Ambulatory Visit: Payer: BC Managed Care – PPO

## 2012-11-28 ENCOUNTER — Inpatient Hospital Stay
Admission: RE | Admit: 2012-11-28 | Discharge: 2012-11-28 | Disposition: A | Discharge status: Home / Self Care | Payer: BC Managed Care – PPO | Source: Ambulatory Visit

## 2012-11-28 VITALS — BP 120/76 | Wt 243.6 lb

## 2012-11-28 NOTE — Progress Notes (Signed)
Phase II: Patient completed exercise today with no s/s. In accordance with the individual exercise prescription, progression principle of duration and intensity will be applied when a steady state of HR/RPE occurs. Continuous ECG telemetry monitoring throughout session. See scanned Q-Tel session report for ECG analysis, workloads and heart rate data.

## 2012-11-30 ENCOUNTER — Inpatient Hospital Stay
Admission: RE | Admit: 2012-11-30 | Discharge: 2012-11-30 | Disposition: A | Discharge status: Home / Self Care | Payer: BC Managed Care – PPO | Source: Ambulatory Visit

## 2012-11-30 ENCOUNTER — Ambulatory Visit: Payer: BC Managed Care – PPO

## 2012-11-30 VITALS — BP 114/76 | Wt 241.5 lb

## 2012-11-30 DIAGNOSIS — Z9861 Coronary angioplasty status: Secondary | ICD-10-CM

## 2012-11-30 DIAGNOSIS — I219 Acute myocardial infarction, unspecified: Secondary | ICD-10-CM

## 2012-11-30 NOTE — Addendum Note (Signed)
Encounter addended by: Meliton Rattan, RN on: 11/30/2012 12:10 PM<BR>     Documentation filed: Inpatient Document Flowsheet

## 2012-11-30 NOTE — Progress Notes (Signed)
Phase II- patient completed continous EKG monitoring  today w/o symptoms.Will progress with duration  and intensity as tolerated.See scanned  QTel report for ECG analysis,workloads and heart rate data.

## 2012-12-02 ENCOUNTER — Inpatient Hospital Stay
Admission: RE | Admit: 2012-12-02 | Discharge: 2012-12-02 | Disposition: A | Discharge status: Home / Self Care | Payer: BC Managed Care – PPO | Source: Ambulatory Visit

## 2012-12-02 ENCOUNTER — Ambulatory Visit: Payer: BC Managed Care – PPO

## 2012-12-02 VITALS — BP 120/80 | Wt 240.3 lb

## 2012-12-02 NOTE — Progress Notes (Signed)
Phase II- patient completed continous EKG monitoring  today w/o symptoms.In accordance with the individual exercise prescription, progression principle  of duration and intensity will be applied when a steady state of HR,RPE occurs.See scanned  QTel report for ECG analysis,workloads and heart rate data.

## 2012-12-05 ENCOUNTER — Inpatient Hospital Stay
Admission: RE | Admit: 2012-12-05 | Discharge: 2012-12-05 | Disposition: A | Discharge status: Home / Self Care | Payer: BC Managed Care – PPO | Source: Ambulatory Visit

## 2012-12-05 ENCOUNTER — Ambulatory Visit: Payer: BC Managed Care – PPO

## 2012-12-05 VITALS — BP 108/60 | Wt 241.1 lb

## 2012-12-05 NOTE — Progress Notes (Signed)
Phase II- patient completed continous EKG monitoring  today w/o symptoms.In accordance with the individual exercise prescription, progression principle  of duration and intensity will be applied when a steady state of HR,RPE occurs.See scanned  QTel report for ECG analysis,workloads and heart rate data.

## 2012-12-07 ENCOUNTER — Inpatient Hospital Stay
Admission: RE | Admit: 2012-12-07 | Discharge: 2012-12-07 | Disposition: A | Discharge status: Home / Self Care | Payer: BC Managed Care – PPO | Source: Ambulatory Visit

## 2012-12-07 ENCOUNTER — Ambulatory Visit: Payer: BC Managed Care – PPO

## 2012-12-07 VITALS — BP 108/74 | Ht 69.02 in | Wt 239.9 lb

## 2012-12-07 DIAGNOSIS — Z9861 Coronary angioplasty status: Secondary | ICD-10-CM

## 2012-12-07 DIAGNOSIS — I219 Acute myocardial infarction, unspecified: Secondary | ICD-10-CM

## 2012-12-07 NOTE — Addendum Note (Signed)
Encounter addended by: Meliton Rattan, RN on: 12/07/2012  9:00 AM<BR>     Documentation filed: Follow-up Section, Inpatient Document Flowsheet

## 2012-12-07 NOTE — Progress Notes (Signed)
Phase II- patient completed continous EKG monitoring  today w/o symptoms.In accordance with the individual exercise prescription, progression principle  of duration and intensity will be applied when a steady state of HR,RPE occurs.See scanned  QTel report for ECG analysis,workloads and heart rate data.

## 2012-12-09 ENCOUNTER — Ambulatory Visit: Payer: BC Managed Care – PPO

## 2012-12-12 ENCOUNTER — Ambulatory Visit: Payer: BC Managed Care – PPO

## 2012-12-12 NOTE — Progress Notes (Signed)
Reviewed and in agreement  with  Cardiac rehab Individualized assessment and treatment plan .

## 2012-12-14 ENCOUNTER — Ambulatory Visit: Payer: BC Managed Care – PPO

## 2012-12-16 ENCOUNTER — Ambulatory Visit: Payer: BC Managed Care – PPO

## 2012-12-19 ENCOUNTER — Ambulatory Visit: Payer: BC Managed Care – PPO

## 2012-12-21 ENCOUNTER — Ambulatory Visit: Payer: BC Managed Care – PPO

## 2012-12-23 ENCOUNTER — Ambulatory Visit: Payer: BC Managed Care – PPO

## 2012-12-26 ENCOUNTER — Ambulatory Visit: Payer: BC Managed Care – PPO

## 2013-01-21 ENCOUNTER — Other Ambulatory Visit: Payer: Self-pay | Admitting: Cardiology

## 2013-03-21 ENCOUNTER — Telehealth (INDEPENDENT_AMBULATORY_CARE_PROVIDER_SITE_OTHER): Payer: Self-pay

## 2013-03-21 NOTE — Telephone Encounter (Signed)
Mrs Henner phoned to say Ray Mooney has a dental appointment tomorrow for a routine cleaning and she wanted to know if there were special precautions. I advised her that he does not need anything for routine cleaning, reminded her to let the dentist know that he is on effient. She states understanding.

## 2013-06-22 ENCOUNTER — Ambulatory Visit (INDEPENDENT_AMBULATORY_CARE_PROVIDER_SITE_OTHER): Payer: BC Managed Care – PPO | Admitting: Internal Medicine

## 2013-06-22 ENCOUNTER — Encounter (INDEPENDENT_AMBULATORY_CARE_PROVIDER_SITE_OTHER): Payer: Self-pay | Admitting: Internal Medicine

## 2013-06-22 VITALS — BP 133/87 | HR 58 | Ht 70.0 in | Wt 251.0 lb

## 2013-06-22 DIAGNOSIS — I2510 Atherosclerotic heart disease of native coronary artery without angina pectoris: Secondary | ICD-10-CM

## 2013-06-22 MED ORDER — ROSUVASTATIN CALCIUM 5 MG PO TABS
5.0000 mg | ORAL_TABLET | Freq: Every day | ORAL | Status: DC
Start: 2013-06-22 — End: 2014-10-30

## 2013-06-22 NOTE — Progress Notes (Signed)
CARDIOLOGY OFFICE VISIT PROGRESS NOTE    DATE OF SERVICE:  06/22/2013  PATIENT:  Ray Mooney     (DOB:  10/26/1970.  male)  PCP:  Edwyna Shell, [place]     CHIEF COMPLAINT:  Asymptomatic    HISTORY OF PRESENT ILLNESS:  Patient presents for follow up S/P LAD Stent  ( remains on Effient)    ---  ECG today was ordered, reviewed and interpreted by me:   Normal sinus rhythm.   Normal QRS.   No ischemic ST changes.   Sinus bradycardia.      PERTINENT PAST MEDICAL HISTORY:    The following history and test results were reviewed and updated by me  --- S/P LAD obstruction with successful stent implantation    PHYSICAL EXAM:   BP 133/87   Pulse 58   Ht 1.778 m (5\' 10" )   Wt 113.853 kg (251 lb)   BMI 36.01 kg/m2     >> Constitutional:   well appearing,  >> Pulmonary:   unlabored respiration,  no wheeze,  no crackles,  >> Cardiovascular:   regular rhythm,  normal S1S2,  II/VI systolic ejection murmur,  >> Neuro:   alert,  oriented,  Psychiatric:   appropriate mood and affect,     FINAL MEDICATIONS LIST:    The following list was reviewed and updated by me  Current Outpatient Prescriptions   Medication Sig Dispense Refill   . aspirin EC 81 MG EC tablet Take 81 mg by mouth daily.       . carvedilol (COREG) 6.25 MG tablet Take 6.25 mg by mouth 2 (two) times daily with meals.       . nitroglycerin (NITROSTAT) 0.4 MG SL tablet Place 0.4 mg under the tongue every 5 (five) minutes as needed.       . prasugrel HCl (EFFIENT) 10 MG Tab Take 10 mg by mouth daily.       . ramipril (ALTACE) 2.5 MG capsule Take 2.5 mg by mouth daily.       Marland Kitchen atorvastatin (LIPITOR) 80 MG tablet Take 80 mg by mouth daily.         ASSESSMENT AND PLAN:    S/P LAD Stent  Hyperlipdemia    Plan    Electronically signed by:  Janace Litten, MD  [facility]

## 2013-06-23 ENCOUNTER — Encounter (INDEPENDENT_AMBULATORY_CARE_PROVIDER_SITE_OTHER): Payer: Self-pay | Admitting: Internal Medicine

## 2013-08-05 ENCOUNTER — Other Ambulatory Visit: Payer: Self-pay | Admitting: Cardiology

## 2013-08-11 NOTE — Telephone Encounter (Signed)
Patient called no answer.Left message on personal voice mail receive a refill request for ramipril when reviewing your chart you have never been seen in office.Advised to call me back to schedule appointment.

## 2013-08-31 NOTE — Telephone Encounter (Signed)
Patient called no answer.Left message on personal voice mail Dr.Jordan okayed refill for ramipril for 1 month supply.Will need to refill with new cardiologist.

## 2013-12-21 ENCOUNTER — Encounter (HOSPITAL_COMMUNITY): Payer: Self-pay | Admitting: Cardiology

## 2014-08-08 IMAGING — CT CT ANGIO CHEST
2 of 7 series · 18 of 36 positions shown · IV contrast (CONTRAST)
Comparison: Chest x-ray obtained earlier today

CLINICAL DATA: Chest pain

CT ANGIOGRAPHY CHEST
TECHNIQUE: Multidetector CT imaging of the chest using the
standard protocol during bolus administration of intravenous
contrast. Multiplanar reconstructed images including MIPs were
obtained and reviewed to evaluate the vascular anatomy.
Contrast: 100mL OMNIPAQUE IOHEXOL 350 MG/ML SOLN

[Series 5: pe thins · axial · 0.68mm/px · z∈[+490,+723]mm · 17 of 263 slices shown]
[im 15/263  lung]
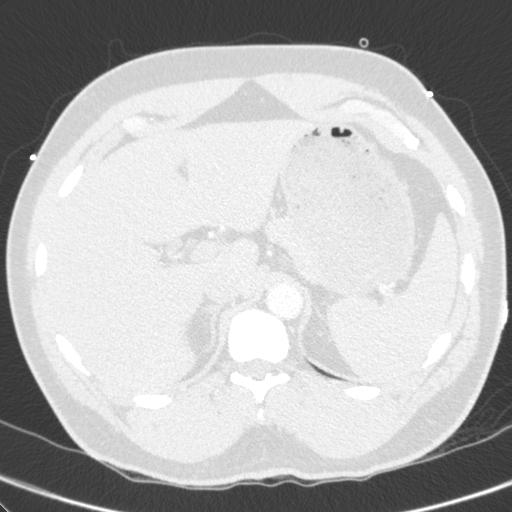
[im 30/263  mediastinal]
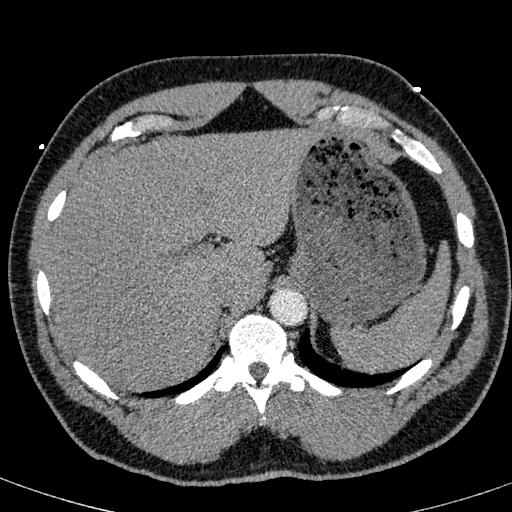
[im 44/263  lung]
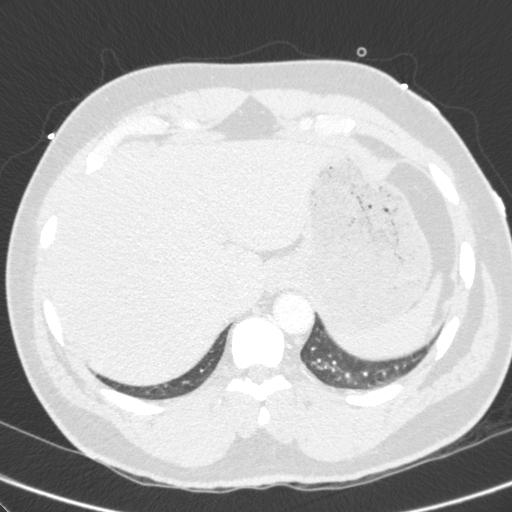
[im 59/263  mediastinal]
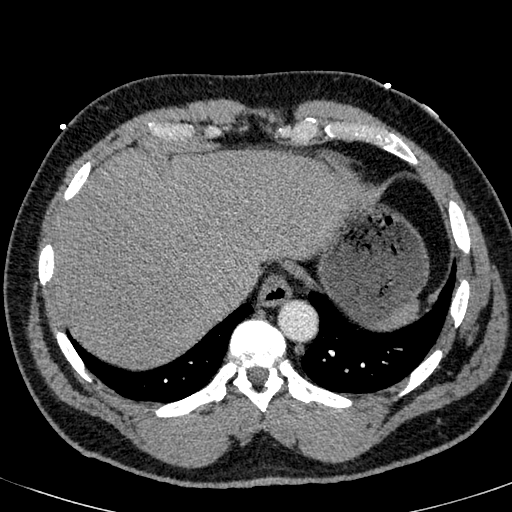
[im 73/263  lung]
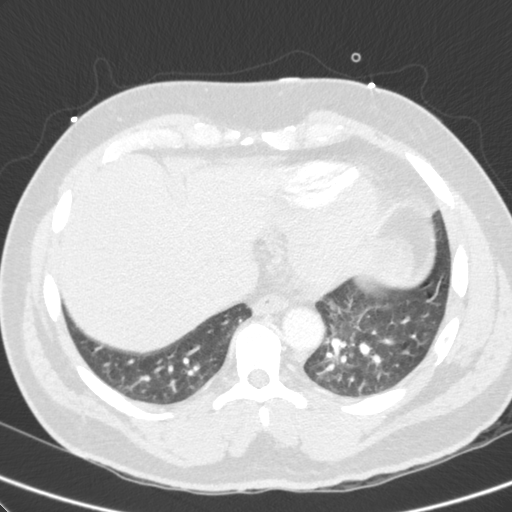
[im 88/263  mediastinal]
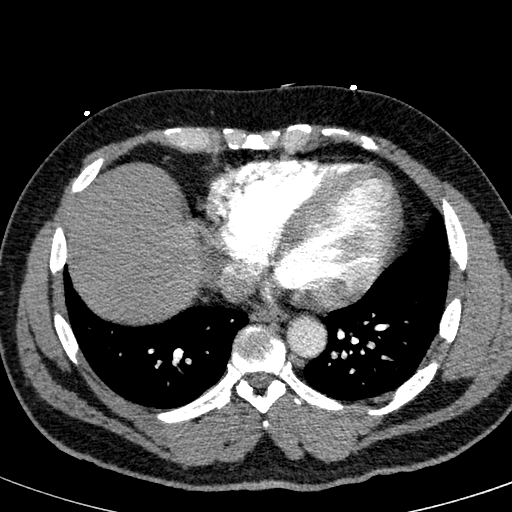
[im 102/263  lung]
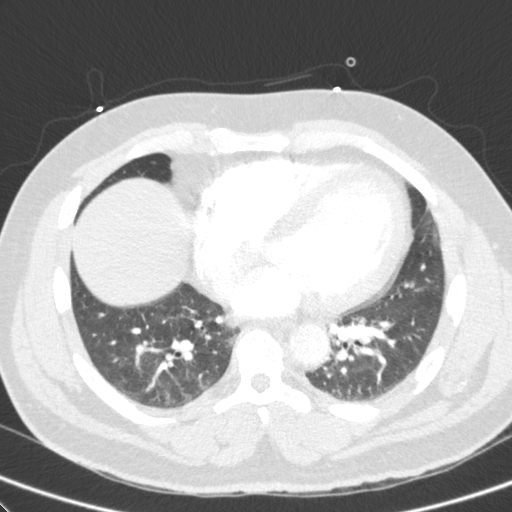
[im 117/263  mediastinal]
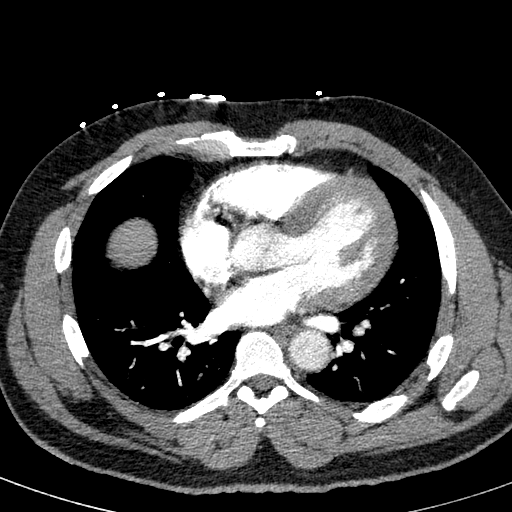
[im 132/263  lung]
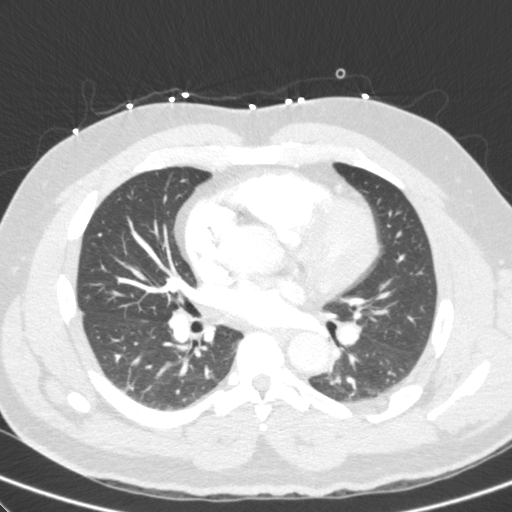
[im 146/263  mediastinal]
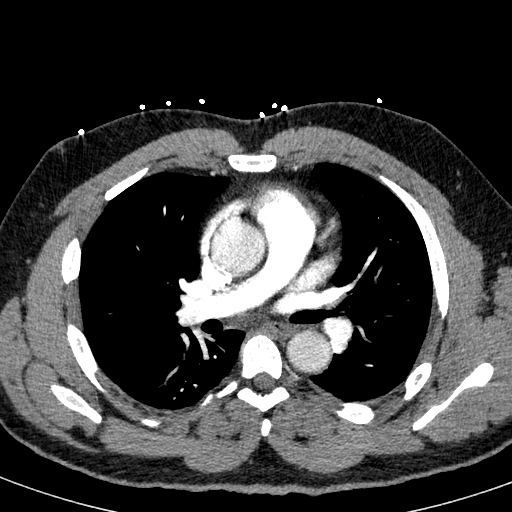
[im 161/263  lung]
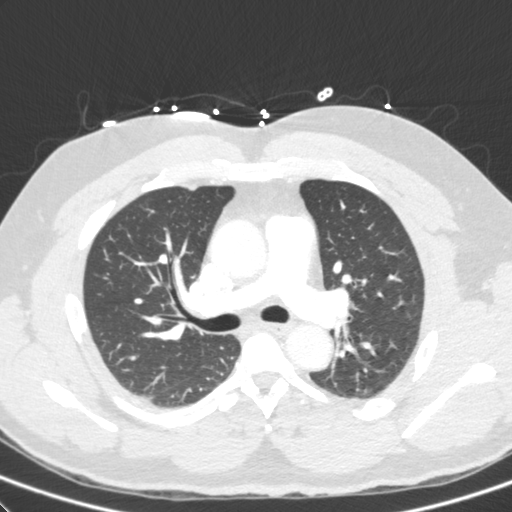
[im 175/263  mediastinal]
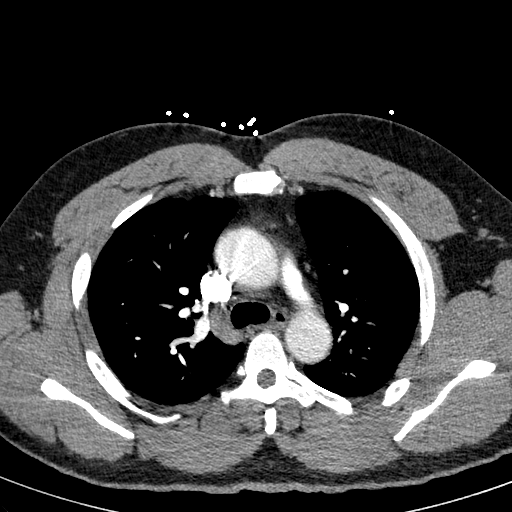
[im 190/263  lung]
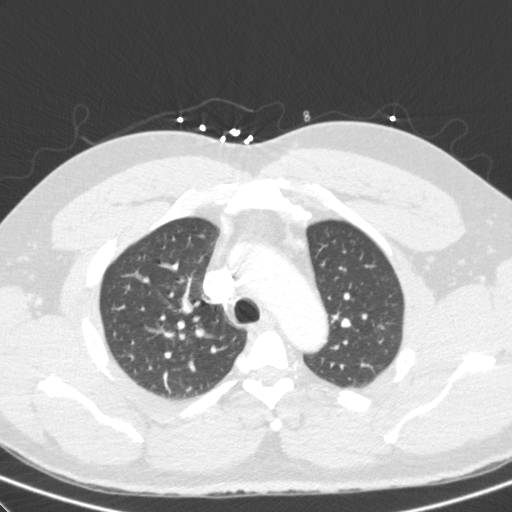
[im 204/263  mediastinal]
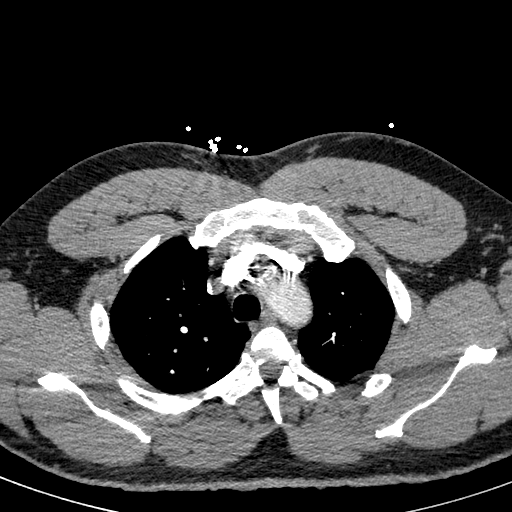
[im 219/263  lung]
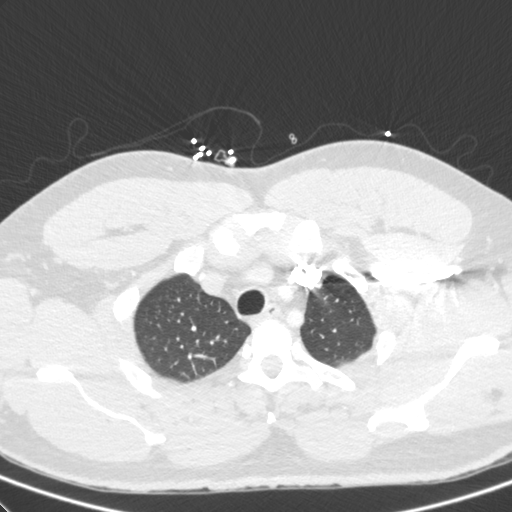
[im 233/263  mediastinal]
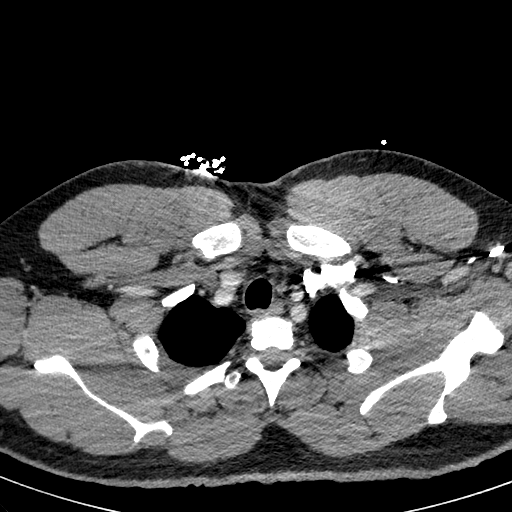
[im 248/263  lung]
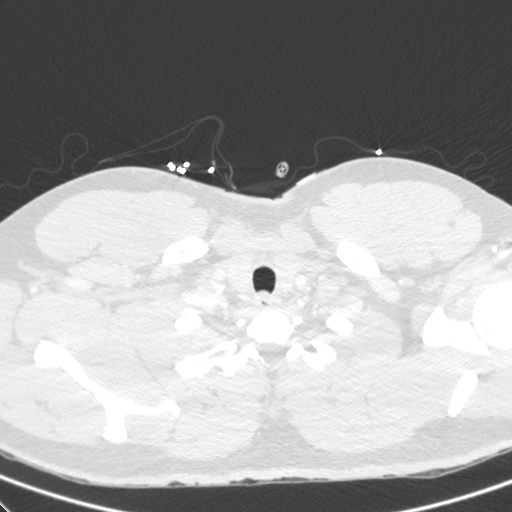

[coronals · coronal · 0.68mm/px · 1 of 106 slices shown]
[im 53/106  mediastinal]
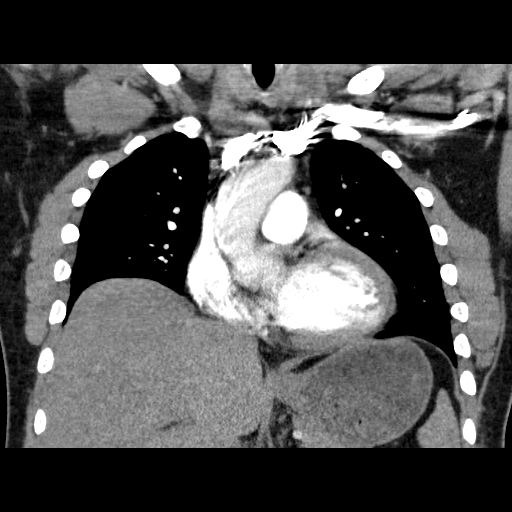

[18 of 36 positions shown; findings below may reference images not displayed]

FINDINGS: Mediastinum: Unremarkable CT appearance of the thyroid gland.  No
suspicious mediastinal or hilar adenopathy.  No soft tissue
mediastinal mass.  The thoracic esophagus is unremarkable.

Heart/Vascular: Limited opacification of the pulmonary arteries
secondary to contrast bolus timing.  The pulmonary arteries are
opacified to the proximal lobar level.  No evidence of large and
central pulmonary embolus. There is a solitary probable acute
segmental PE with an AL right apical segmental pulmonary artery.
However, this vessel lies in a region of streak artifact adjacent
to the contrast-filled superior vena cava and is also in a region
of respiratory motion.

Borderline cardiomegaly with left ventricular enlargement.  No
pericardial effusion.

Lungs/Pleura: Mild dependent atelectasis in the bilateral lower
lobes.  Respiratory motion limits evaluation for small pulmonary
nodules.  No focal airspace consolidation, pleural effusion or
pneumothorax.

Upper Abdomen: Solitary calcification within the liver is
nonspecific.  Otherwise, the visualized upper abdomen is
unremarkable.

Bones: No acute fracture or aggressive appearing lytic or blastic
osseous lesion.
IMPRESSION: 1. Limited evaluation of the pulmonary artery secondary to contrast
bolus timing.  There is a questionable solitary segmental pulmonary
embolus and a new right apical segmental pulmonary artery.
However, this artery lies in a region of both streak and motion
artifact.  If clinically warranted, CT PE study could be repeated
in 12-24 hours.  Alternately, the bilateral lower extremity DVT
ultrasound could be considered.

2.  Cardiomegaly with left ventricular enlargement.

These results were called by telephone on 08/27/2012 at [DATE] to
Dr. Mouna, who verbally acknowledged these results.

## 2014-08-30 ENCOUNTER — Ambulatory Visit (INDEPENDENT_AMBULATORY_CARE_PROVIDER_SITE_OTHER): Payer: BC Managed Care – PPO | Admitting: Internal Medicine

## 2014-08-30 ENCOUNTER — Encounter (INDEPENDENT_AMBULATORY_CARE_PROVIDER_SITE_OTHER): Payer: Self-pay | Admitting: Internal Medicine

## 2014-08-30 VITALS — BP 140/100 | HR 71 | Resp 18 | Ht 70.0 in | Wt 259.0 lb

## 2014-08-30 DIAGNOSIS — I2510 Atherosclerotic heart disease of native coronary artery without angina pectoris: Secondary | ICD-10-CM

## 2014-08-30 DIAGNOSIS — I10 Essential (primary) hypertension: Secondary | ICD-10-CM

## 2014-08-30 MED ORDER — VERAPAMIL HCL 120 MG PO TABS
120.0000 mg | ORAL_TABLET | Freq: Three times a day (TID) | ORAL | Status: DC
Start: 2014-08-30 — End: 2014-10-30

## 2014-08-30 NOTE — Progress Notes (Signed)
[facility]      No chief complaint on file.      I had the pleasure of seeing Mr. Hisaw today for cardiovascular follow up. He is a pleasant 44 y.o. male with a history of stable CAD ( S/P LAD Stent 2014)  who presents for continued management.  Has stopped all of his medications. Asymptomatic. Rides about 50 miles/week. Had  GI problems with Lipitor.   His father died of cardiac arrest age 45.  Still overweight.      MEDICATIONS:     Current Outpatient Prescriptions   Medication Sig Dispense Refill   . aspirin EC 81 MG EC tablet Take 81 mg by mouth daily.     . carvedilol (COREG) 6.25 MG tablet Take 6.25 mg by mouth 2 (two) times daily with meals.     . nitroglycerin (NITROSTAT) 0.4 MG SL tablet Place 0.4 mg under the tongue every 5 (five) minutes as needed.     . prasugrel HCl (EFFIENT) 10 MG Tab Take 10 mg by mouth daily.     . ramipril (ALTACE) 2.5 MG capsule Take 2.5 mg by mouth daily.     . rosuvastatin (CRESTOR) 5 MG tablet Take 1 tablet (5 mg total) by mouth daily. 90 tablet 3     No current facility-administered medications for this visit.       REVIEW OF SYSTEMS: All other systems reviewed and negative except as above.    PHYSICAL EXAMINATION  Vital Signs: BP 137/78 mmHg  Pulse 71  Resp 18  Ht 1.778 m (5\' 10" )  Wt 117.482 kg (259 lb)  BMI 37.16 kg/m2   Vital signs reviewed    Wt Readings from Last 3 Encounters:   08/30/14 117.482 kg (259 lb)   06/22/13 113.853 kg (251 lb)   12/07/12 108.818 kg (239 lb 14.4 oz)        General Appearance:  A well-appearing male in no acute distress.    HEENT: Sclera anicteric, conjunctiva without pallor, moist mucous membranes, normal dentition.   Neck:  Supple without jugular venous distention.  Normal carotid upstrokes without bruits.   Chest: Clear to auscultation bilaterally with good air movement and respiratory effort and no wheezes, rales, or rhonchi   Cardiac: RRR.  Normal S1 and physiologically split S2, without gallops or rub. No  murmurs.  PMI of normal size and nondisplaced.   Vascular:  2+ carotid, radial, and distal pulses bilaterally  Abdomen: Soft, nontender, nondistended, with normoactive bowel sounds.  No pulsatile masses, or bruits.   Extremities: Warm without edema, clubbing, or cyanosis.   Skin: No rash, warm, appropriate for race.   Neuro: Alert and oriented x3. Grossly intact.  CN II-XII intact.  Normal mood and affect.     ECG:   Independent review shows: nsr and NS-ST changes      ASSESSMENT/PLAN:    1. Stable CAD                S/p LAD Stent 2014  2. Essential Hypertension  3. Hyperlipidemia    Plan    Verapamil 120 mg daily  Resume Crestor 5 mg daily  Lipid panel 6 weeks        Janace Litten, MD  08/30/2014

## 2014-09-04 ENCOUNTER — Encounter (INDEPENDENT_AMBULATORY_CARE_PROVIDER_SITE_OTHER): Payer: Self-pay | Admitting: Internal Medicine

## 2014-10-30 ENCOUNTER — Ambulatory Visit (INDEPENDENT_AMBULATORY_CARE_PROVIDER_SITE_OTHER): Payer: BC Managed Care – PPO | Admitting: Internal Medicine

## 2014-10-30 ENCOUNTER — Encounter (INDEPENDENT_AMBULATORY_CARE_PROVIDER_SITE_OTHER): Payer: Self-pay | Admitting: Internal Medicine

## 2014-10-30 VITALS — BP 120/80 | HR 63 | Resp 16 | Ht 70.0 in | Wt 256.0 lb

## 2014-10-30 DIAGNOSIS — I2510 Atherosclerotic heart disease of native coronary artery without angina pectoris: Secondary | ICD-10-CM

## 2014-10-30 DIAGNOSIS — I10 Essential (primary) hypertension: Secondary | ICD-10-CM

## 2014-10-30 MED ORDER — RAMIPRIL 2.5 MG PO CAPS
2.5000 mg | ORAL_CAPSULE | Freq: Every day | ORAL | Status: DC
Start: 2014-10-30 — End: 2016-01-02

## 2014-10-30 MED ORDER — ROSUVASTATIN CALCIUM 5 MG PO TABS
5.0000 mg | ORAL_TABLET | Freq: Every day | ORAL | Status: DC
Start: 2014-10-30 — End: 2017-02-04

## 2014-10-30 MED ORDER — VERAPAMIL HCL 120 MG PO TABS
120.0000 mg | ORAL_TABLET | Freq: Three times a day (TID) | ORAL | Status: DC
Start: 2014-10-30 — End: 2016-01-02

## 2014-10-30 MED ORDER — CARVEDILOL 6.25 MG PO TABS
6.2500 mg | ORAL_TABLET | Freq: Two times a day (BID) | ORAL | Status: DC
Start: 2014-10-30 — End: 2016-01-02

## 2014-10-30 NOTE — Progress Notes (Signed)
[facility]      Chief Complaint   Patient presents with   . Coronary Artery Disease       I had the pleasure of seeing Mr. Limbaugh today for cardiovascular follow up. He is a pleasant 44 y.o. male with a history of essential hypertension who presents for continued management.        MEDICATIONS:     Current Outpatient Prescriptions   Medication Sig Dispense Refill   . aspirin EC 81 MG EC tablet Take 81 mg by mouth daily.     . carvedilol (COREG) 6.25 MG tablet Take 6.25 mg by mouth 2 (two) times daily with meals.     . nitroglycerin (NITROSTAT) 0.4 MG SL tablet Place 0.4 mg under the tongue every 5 (five) minutes as needed.     . ramipril (ALTACE) 2.5 MG capsule Take 2.5 mg by mouth daily.     . rosuvastatin (CRESTOR) 5 MG tablet Take 1 tablet (5 mg total) by mouth daily. 90 tablet 3   . verapamil (CALAN) 120 MG tablet Take 1 tablet (120 mg total) by mouth 3 (three) times daily. 60 tablet 3   . prasugrel HCl (EFFIENT) 10 MG Tab Take 10 mg by mouth daily.       No current facility-administered medications for this visit.       REVIEW OF SYSTEMS: All other systems reviewed and negative except as above.    PHYSICAL EXAMINATION  Vital Signs: BP 120/80 mmHg  Pulse 63  Resp 16  Ht 1.778 m (5\' 10" )  Wt 116.121 kg (256 lb)  BMI 36.73 kg/m2   Vital signs reviewed    Wt Readings from Last 3 Encounters:   10/30/14 116.121 kg (256 lb)   08/30/14 117.482 kg (259 lb)   06/22/13 113.853 kg (251 lb)        General Appearance:  A well-appearing male in no acute distress.    HEENT: Sclera anicteric, conjunctiva without pallor, moist mucous membranes, normal dentition.   Neck:  Supple without jugular venous distention.  Normal carotid upstrokes without bruits.   Chest: Clear to auscultation bilaterally with good air movement and respiratory effort and no wheezes, rales, or rhonchi   Cardiac: RRR.  Normal S1 and physiologically split S2, without gallops or rub. No murmurs.  PMI of normal size and  nondisplaced.   Vascular:  2+ carotid, radial, and distal pulses bilaterally  Abdomen: Soft, nontender, nondistended, with normoactive bowel sounds.  No pulsatile masses, or bruits.   Extremities: Warm without edema, clubbing, or cyanosis.   Skin: No rash, warm, appropriate for race.   Neuro: Alert and oriented x3. Grossly intact.  CN II-XII intact.  Normal mood and affect.     ECG:   Independent review shows: Sinus with PAC's      ASSESSMENT/PLAN:    Essential Hypertension  Stable CAD  Hyperlipidemia          Janace Litten, MD  10/30/2014

## 2014-10-31 ENCOUNTER — Encounter (INDEPENDENT_AMBULATORY_CARE_PROVIDER_SITE_OTHER): Payer: Self-pay | Admitting: Internal Medicine

## 2016-01-02 ENCOUNTER — Ambulatory Visit (INDEPENDENT_AMBULATORY_CARE_PROVIDER_SITE_OTHER): Payer: BC Managed Care – PPO | Admitting: Internal Medicine

## 2016-01-02 ENCOUNTER — Encounter (INDEPENDENT_AMBULATORY_CARE_PROVIDER_SITE_OTHER): Payer: Self-pay | Admitting: Internal Medicine

## 2016-01-02 VITALS — BP 138/89 | HR 71 | Resp 16 | Ht 70.0 in | Wt 256.0 lb

## 2016-01-02 DIAGNOSIS — I2510 Atherosclerotic heart disease of native coronary artery without angina pectoris: Secondary | ICD-10-CM

## 2016-01-02 DIAGNOSIS — I10 Essential (primary) hypertension: Secondary | ICD-10-CM

## 2016-01-02 MED ORDER — RAMIPRIL 2.5 MG PO CAPS
2.5000 mg | ORAL_CAPSULE | Freq: Every day | ORAL | 3 refills | Status: DC
Start: 2016-01-02 — End: 2016-04-01

## 2016-01-02 MED ORDER — CARVEDILOL 6.25 MG PO TABS
6.2500 mg | ORAL_TABLET | Freq: Two times a day (BID) | ORAL | 3 refills | Status: DC
Start: 2016-01-02 — End: 2016-09-12

## 2016-01-02 MED ORDER — NITROGLYCERIN 0.4 MG SL SUBL
0.4000 mg | SUBLINGUAL_TABLET | SUBLINGUAL | 3 refills | Status: AC | PRN
Start: 2016-01-02 — End: ?

## 2016-01-02 MED ORDER — VERAPAMIL HCL 120 MG PO TABS
120.0000 mg | ORAL_TABLET | Freq: Three times a day (TID) | ORAL | 3 refills | Status: DC
Start: 2016-01-02 — End: 2017-02-04

## 2016-01-02 NOTE — Progress Notes (Signed)
[facility]      Chief Complaint   Patient presents with   . Coronary Artery Disease   . Medication Refill       I had the pleasure of seeing Ray Mooney today for cardiovascular follow up. He is a pleasant 45 y.o. male with a history of  stable CAD ( S/P LAD Stent 2014) who presents for continued management.    Currently without any acute symptoms.  Denies any dizziness, lightheadedness.  No shortness of breath, wheezing or cough.  No palpitations chest pain, PND, orthopnea or ankle edema.  No abdominal pain.  No nausea or vomiting. No neurological symptoms.  No fever or chills    MEDICATIONS:     Current Outpatient Prescriptions   Medication Sig Dispense Refill   . aspirin EC 81 MG EC tablet Take 81 mg by mouth daily.     . carvedilol (COREG) 6.25 MG tablet Take 1 tablet (6.25 mg total) by mouth 2 (two) times daily with meals. 120 tablet 3   . nitroglycerin (NITROSTAT) 0.4 MG SL tablet Place 0.4 mg under the tongue every 5 (five) minutes as needed.     . prasugrel HCl (EFFIENT) 10 MG Tab Take 10 mg by mouth daily.     . ramipril (ALTACE) 2.5 MG capsule Take 1 capsule (2.5 mg total) by mouth daily. 90 capsule 3   . rosuvastatin (CRESTOR) 5 MG tablet Take 1 tablet (5 mg total) by mouth daily. 90 tablet 3   . verapamil (CALAN) 120 MG tablet Take 1 tablet (120 mg total) by mouth 3 (three) times daily. 60 tablet 3     No current facility-administered medications for this visit.        REVIEW OF SYSTEMS: All other systems reviewed and negative except as above.    PHYSICAL EXAMINATION  Vital Signs: BP 138/89   Pulse 71   Resp 16   Ht 1.778 m (5\' 10" )   Wt 116.1 kg (256 lb)   BMI 36.73 kg/m    Vital signs reviewed    Wt Readings from Last 3 Encounters:   01/02/16 116.1 kg (256 lb)   10/30/14 116.1 kg (256 lb)   08/30/14 117.5 kg (259 lb)        General Appearance:  A well-appearing male in no acute distress.    HEENT: Sclera anicteric, conjunctiva without pallor, moist mucous membranes,  normal dentition.   Neck:  Supple without jugular venous distention.  Normal carotid upstrokes without bruits.   Chest: Clear to auscultation bilaterally with good air movement and respiratory effort and no wheezes, rales, or rhonchi   Cardiac: RRR.  Normal S1 and physiologically split S2, without gallops or rub. No murmurs.  PMI of normal size and nondisplaced.   Vascular:  2+ carotid, radial, and distal pulses bilaterally  Abdomen: Soft, nontender, nondistended, with normoactive bowel sounds.  No pulsatile masses, or bruits.   Extremities: Warm without edema, clubbing, or cyanosis.   Skin: No rash, warm, appropriate for race.   Neuro: Alert and oriented x3. Grossly intact.  CN II-XII intact.  Normal mood and affect.     ECG:   Independent review shows:nsr and pac's      ASSESSMENT/PLAN:     1. Stable CAD ( S/P LAD Stent 2014)   2. Hyperlipidemia    Plan    Continue current RX          Janace Litten, MD  01/02/2016

## 2016-01-25 ENCOUNTER — Other Ambulatory Visit: Payer: BC Managed Care – PPO

## 2016-02-01 ENCOUNTER — Ambulatory Visit
Admission: RE | Admit: 2016-02-01 | Discharge: 2016-02-01 | Disposition: A | Discharge status: Home / Self Care | Payer: BC Managed Care – PPO | Source: Ambulatory Visit | Attending: Internal Medicine | Admitting: Internal Medicine

## 2016-02-01 DIAGNOSIS — I2510 Atherosclerotic heart disease of native coronary artery without angina pectoris: Secondary | ICD-10-CM | POA: Insufficient documentation

## 2016-02-01 LAB — LIPID PANEL
Cholesterol / HDL Ratio: 6.7
Cholesterol: 220 mg/dL — ABNORMAL HIGH (ref 0–199)
HDL: 33 mg/dL — ABNORMAL LOW (ref 40–9999)
LDL Calculated: 165 mg/dL — ABNORMAL HIGH (ref 0–99)
Triglycerides: 111 mg/dL (ref 34–149)
VLDL Calculated: 22 mg/dL (ref 10–40)

## 2016-02-01 LAB — HEMOLYSIS INDEX: Hemolysis Index: 12 (ref 0–18)

## 2016-04-01 ENCOUNTER — Other Ambulatory Visit (INDEPENDENT_AMBULATORY_CARE_PROVIDER_SITE_OTHER): Payer: Self-pay | Admitting: Internal Medicine

## 2016-04-01 DIAGNOSIS — I2510 Atherosclerotic heart disease of native coronary artery without angina pectoris: Secondary | ICD-10-CM

## 2016-04-01 DIAGNOSIS — I10 Essential (primary) hypertension: Secondary | ICD-10-CM

## 2016-07-19 ENCOUNTER — Other Ambulatory Visit (INDEPENDENT_AMBULATORY_CARE_PROVIDER_SITE_OTHER): Payer: Self-pay | Admitting: Internal Medicine

## 2016-07-19 DIAGNOSIS — I10 Essential (primary) hypertension: Secondary | ICD-10-CM

## 2016-07-19 DIAGNOSIS — I2510 Atherosclerotic heart disease of native coronary artery without angina pectoris: Secondary | ICD-10-CM

## 2016-09-12 ENCOUNTER — Other Ambulatory Visit (INDEPENDENT_AMBULATORY_CARE_PROVIDER_SITE_OTHER): Payer: Self-pay | Admitting: Internal Medicine

## 2016-09-12 DIAGNOSIS — I10 Essential (primary) hypertension: Secondary | ICD-10-CM

## 2016-09-12 DIAGNOSIS — I2510 Atherosclerotic heart disease of native coronary artery without angina pectoris: Secondary | ICD-10-CM

## 2017-02-04 ENCOUNTER — Encounter (INDEPENDENT_AMBULATORY_CARE_PROVIDER_SITE_OTHER): Payer: Self-pay | Admitting: Internal Medicine

## 2017-02-04 ENCOUNTER — Ambulatory Visit (INDEPENDENT_AMBULATORY_CARE_PROVIDER_SITE_OTHER): Payer: BC Managed Care – PPO | Admitting: Internal Medicine

## 2017-02-04 VITALS — BP 144/87 | HR 78 | Temp 98.3°F | Resp 18 | Ht 69.6 in | Wt 260.0 lb

## 2017-02-04 DIAGNOSIS — E785 Hyperlipidemia, unspecified: Secondary | ICD-10-CM

## 2017-02-04 DIAGNOSIS — I2510 Atherosclerotic heart disease of native coronary artery without angina pectoris: Secondary | ICD-10-CM

## 2017-02-04 DIAGNOSIS — I10 Essential (primary) hypertension: Secondary | ICD-10-CM

## 2017-02-04 MED ORDER — LOSARTAN POTASSIUM-HCTZ 100-25 MG PO TABS
1.0000 | ORAL_TABLET | Freq: Every day | ORAL | 3 refills | Status: AC
Start: 2017-02-04 — End: ?

## 2017-02-04 NOTE — Progress Notes (Signed)
[facility]      Chief Complaint   Patient presents with   . Hypertension       I had the pleasure of seeing Mr. Latulippe today for cardiovascular follow up. He is a pleasant 47 y.o. male with a history of  1. Stable CAD ( S/P LAD Stent 2014)   2. Hyperlipidemia who presents for continued management.   Asymptomatic . No chest pain or shortness of breath.        MEDICATIONS:     Current Outpatient Prescriptions   Medication Sig Dispense Refill   . aspirin EC 81 MG EC tablet Take 81 mg by mouth daily.     . carvedilol (COREG) 6.25 MG tablet TAKE 1 TABLET (6.25 MG TOTAL) BY MOUTH 2 (TWO) TIMES DAILY WITH MEALS. 120 tablet 3   . nitroglycerin (NITROSTAT) 0.4 MG SL tablet Place 1 tablet (0.4 mg total) under the tongue every 5 (five) minutes as needed (Chest Pain). 30 tablet 3   . ramipril (ALTACE) 2.5 MG capsule TAKE 1 CAPSULE (2.5 MG TOTAL) BY MOUTH DAILY. 90 capsule 0     No current facility-administered medications for this visit.        REVIEW OF SYSTEMS: All other systems reviewed and negative except as above.    PHYSICAL EXAMINATION  Vital Signs: BP 144/87   Pulse 78   Temp 98.3 F (36.8 C) (Oral)   Resp 18   Ht 1.768 m (5' 9.6")   Wt 117.9 kg (260 lb)   SpO2 98%   BMI 37.74 kg/m    Vital signs reviewed    Wt Readings from Last 3 Encounters:   02/04/17 117.9 kg (260 lb)   01/02/16 116.1 kg (256 lb)   10/30/14 116.1 kg (256 lb)      General Appearance:  A well-appearing male in no acute distress.    HEENT: Sclera anicteric, conjunctiva without pallor, moist mucous membranes, normal dentition.   Neck:  Supple without jugular venous distention.  Normal carotid upstrokes without bruits.   Chest: Clear to auscultation bilaterally with good air movement and respiratory effort and no wheezes, rales, or rhonchi   Cardiac: RRR.  Normal S1 and physiologically split S2, without gallops or rub. No murmurs.   Vascular:  2+ carotid, radial pulses bilaterally  Abdomen: Soft, nontender,  nondistended, with normoactive bowel sounds.  No bruits.   Extremities: Warm without edema, clubbing, or cyanosis.   Skin: No rash, warm, appropriate for race.   Neuro: Alert and oriented x3. Grossly intact.  CN II-XII intact.  Normal mood and affect.     ECG:     Independent review shows:nsr and no acute changes    ASSESSMENT/PLAN:    Stable CAD (s/p LAD stenting)  Essential Hypertension    Plan     Increase BP medications to Losartan-Hctz 100-25 mg    Janace Litten, MD  02/04/2017

## 2017-02-19 ENCOUNTER — Ambulatory Visit (INDEPENDENT_AMBULATORY_CARE_PROVIDER_SITE_OTHER): Payer: BC Managed Care – PPO

## 2017-02-19 ENCOUNTER — Ambulatory Visit (INDEPENDENT_AMBULATORY_CARE_PROVIDER_SITE_OTHER): Payer: BLUE CROSS/BLUE SHIELD | Admitting: Cardiovascular Disease

## 2017-02-19 VITALS — Ht 70.0 in | Wt 256.0 lb

## 2017-02-19 DIAGNOSIS — I25119 Atherosclerotic heart disease of native coronary artery with unspecified angina pectoris: Secondary | ICD-10-CM

## 2017-02-19 DIAGNOSIS — Z9861 Coronary angioplasty status: Secondary | ICD-10-CM

## 2017-02-19 DIAGNOSIS — I2510 Atherosclerotic heart disease of native coronary artery without angina pectoris: Secondary | ICD-10-CM

## 2017-02-19 MED ORDER — TECHNETIUM TC 99M TETROFOSMIN INJECTION
1.0000 | Freq: Once | Status: AC
Start: 2017-02-19 — End: 2017-02-19
  Administered 2017-02-19: 1 via INTRAVENOUS

## 2017-02-19 NOTE — Procedures (Signed)
EXERCISE STRESS NUCLEAR ECG REPORT    [facility]  Tel [phone]      PATIENT:  Ray Mooney         MRN: [id number].  Gender: male.  DOB: 1970-10-20.  Age: 47 y.o.    Date of study:  02/19/2017    Ordering Physician:  Janace Litten, MD  Primary Physician:  Edwyna Shell, MD  Primary Cardiologist:  Janace Litten, MD    INDICATION: CAD, HTN, HLD    STRESS ECG DATA:   Resting ECG:  Normal sinus rhythm.  LAD    Protocol:  Bruce   Exercise time:  8 minutes 04 seconds.   Vitals at rest:  62 bpm, 158/90 mmHg  Vitals at peak exercise:  152 bpm, 168/90 mmHg    % of age predicted maximum heart rate:  90%   METS achieved: 10.1  Symptoms: fatigue    Reason for stopping exercise:  fatigue  Achieved target heart rate:  yes   ST changes at peak exercise:  none  Arrhythmias:  none  Recovery:  No Chest pain    STRESS ECG IMPRESSION:   1. No chest pain or ECG changes concerning for myocardial ischemia at 90% of age-predicted maximum heart rate.  2. No significant arrhythmia with stress  3. Nuclear Imaging to follow.      Stress ECG interpreted and electronically signed by:  Leamon Arnt, MD

## 2017-02-22 ENCOUNTER — Telehealth (INDEPENDENT_AMBULATORY_CARE_PROVIDER_SITE_OTHER): Payer: Self-pay

## 2017-02-22 NOTE — Telephone Encounter (Signed)
-----   Message from Stephens Shire, MD sent at 02/19/2017  4:42 PM EST -----  Regarding: nuc  No significant abnormalities. F/u as directed

## 2017-02-22 NOTE — Telephone Encounter (Signed)
Spoke to pt and relayed nuclear test result

## 2017-02-22 NOTE — Telephone Encounter (Signed)
I called and spoke with patient.       Janace Litten, MD  Michiel Sites, RN            Normal Stress Test

## 2017-03-05 ENCOUNTER — Encounter (INDEPENDENT_AMBULATORY_CARE_PROVIDER_SITE_OTHER): Payer: Self-pay | Admitting: Internal Medicine

## 2017-04-02 ENCOUNTER — Other Ambulatory Visit (INDEPENDENT_AMBULATORY_CARE_PROVIDER_SITE_OTHER): Payer: Self-pay | Admitting: Internal Medicine

## 2017-04-02 DIAGNOSIS — I2510 Atherosclerotic heart disease of native coronary artery without angina pectoris: Secondary | ICD-10-CM

## 2017-04-02 DIAGNOSIS — I10 Essential (primary) hypertension: Secondary | ICD-10-CM

## 2017-09-13 ENCOUNTER — Other Ambulatory Visit (INDEPENDENT_AMBULATORY_CARE_PROVIDER_SITE_OTHER): Payer: Self-pay | Admitting: Internal Medicine

## 2017-09-13 DIAGNOSIS — I2510 Atherosclerotic heart disease of native coronary artery without angina pectoris: Secondary | ICD-10-CM

## 2017-09-13 DIAGNOSIS — I10 Essential (primary) hypertension: Secondary | ICD-10-CM

## 2018-06-16 ENCOUNTER — Other Ambulatory Visit (INDEPENDENT_AMBULATORY_CARE_PROVIDER_SITE_OTHER): Payer: Self-pay

## 2018-06-16 DIAGNOSIS — I10 Essential (primary) hypertension: Secondary | ICD-10-CM

## 2018-06-16 DIAGNOSIS — I2510 Atherosclerotic heart disease of native coronary artery without angina pectoris: Secondary | ICD-10-CM

## 2018-06-16 MED ORDER — CARVEDILOL 6.25 MG PO TABS
6.2500 mg | ORAL_TABLET | Freq: Two times a day (BID) | ORAL | 0 refills | Status: DC
Start: 2018-06-16 — End: 2018-09-20

## 2018-06-16 MED ORDER — RAMIPRIL 2.5 MG PO CAPS
2.5000 mg | ORAL_CAPSULE | Freq: Every day | ORAL | 0 refills | Status: DC
Start: 2018-06-16 — End: 2018-09-20

## 2018-06-16 NOTE — Telephone Encounter (Signed)
Pt last seen 02/04/17, asked to return 04/2017 per Dr. Thelma Barge. Called pt to offer Telemedicine visit.     Pt schedule for 06/20/18.

## 2018-06-20 ENCOUNTER — Encounter (INDEPENDENT_AMBULATORY_CARE_PROVIDER_SITE_OTHER): Payer: Self-pay | Admitting: Internal Medicine

## 2018-06-20 ENCOUNTER — Telehealth (INDEPENDENT_AMBULATORY_CARE_PROVIDER_SITE_OTHER): Payer: BC Managed Care – PPO | Admitting: Internal Medicine

## 2018-06-20 DIAGNOSIS — I10 Essential (primary) hypertension: Secondary | ICD-10-CM

## 2018-06-20 NOTE — Progress Notes (Signed)
[facility]      Chief Complaint   Patient presents with   . Follow-up       I had the pleasure of seeing Ray Mooney today for cardiovascular follow up. He is a pleasant 48 y.o. male with a history of essential hypertension  who presents for continued management.  Has run out and has not taken Losartan for the last 4 months. He is only taking Carvedilol 6.25 mg BID and ramipril ( Altace) 2.5 mg daily. His last BP was 162/81.  He is otherwise asymptomatic.  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     MEDICATIONS:     Current Outpatient Medications   Medication Sig Dispense Refill   . aspirin EC 81 MG EC tablet Take 81 mg by mouth daily.     . carvedilol (COREG) 6.25 MG tablet Take 1 tablet (6.25 mg total) by mouth 2 (two) times daily with meals 180 tablet 0   . losartan-hydrochlorothiazide (HYZAAR) 100-25 MG per tablet Take 1 tablet by mouth daily. 90 tablet 3   . nitroglycerin (NITROSTAT) 0.4 MG SL tablet Place 1 tablet (0.4 mg total) under the tongue every 5 (five) minutes as needed (Chest Pain). 30 tablet 3   . ramipril (ALTACE) 2.5 MG capsule Take 1 capsule (2.5 mg total) by mouth daily 90 capsule 0     No current facility-administered medications for this visit.        REVIEW OF SYSTEMS: All other systems reviewed and negative except as above.    PHYSICAL EXAMINATION  Vital Signs: BP 162/81 (BP Site: Right arm, Patient Position: Sitting)   Pulse 68   Ht 1.778 m (5\' 10" )   Wt 115.2 kg (254 lb)   BMI 36.45 kg/m    Vital signs reviewed  Blood pressure taken by the patient    Wt Readings from Last 3 Encounters:   06/20/18 115.2 kg (254 lb)   02/19/17 116.1 kg (256 lb)   02/04/17 117.9 kg (260 lb)        General Appearance:  A well-appearing male in no acute distress.      ASSESSMENT/PLAN:    Essential Hypertension    Double Coreg and Alatace   RTC 2  week  Blood pressure 3 recording and average    Janace Litten, MD  06/20/2018

## 2018-09-18 ENCOUNTER — Other Ambulatory Visit (INDEPENDENT_AMBULATORY_CARE_PROVIDER_SITE_OTHER): Payer: Self-pay | Admitting: Internal Medicine

## 2018-09-18 DIAGNOSIS — I2510 Atherosclerotic heart disease of native coronary artery without angina pectoris: Secondary | ICD-10-CM

## 2018-09-18 DIAGNOSIS — I10 Essential (primary) hypertension: Secondary | ICD-10-CM

## 2018-12-27 ENCOUNTER — Other Ambulatory Visit (INDEPENDENT_AMBULATORY_CARE_PROVIDER_SITE_OTHER): Payer: Self-pay | Admitting: Internal Medicine

## 2018-12-27 DIAGNOSIS — I10 Essential (primary) hypertension: Secondary | ICD-10-CM

## 2018-12-27 DIAGNOSIS — I2510 Atherosclerotic heart disease of native coronary artery without angina pectoris: Secondary | ICD-10-CM

## 2019-04-05 ENCOUNTER — Other Ambulatory Visit (INDEPENDENT_AMBULATORY_CARE_PROVIDER_SITE_OTHER): Payer: Self-pay | Admitting: Internal Medicine

## 2019-04-05 DIAGNOSIS — I10 Essential (primary) hypertension: Secondary | ICD-10-CM

## 2019-04-05 DIAGNOSIS — I2510 Atherosclerotic heart disease of native coronary artery without angina pectoris: Secondary | ICD-10-CM

## 2019-07-09 ENCOUNTER — Other Ambulatory Visit (INDEPENDENT_AMBULATORY_CARE_PROVIDER_SITE_OTHER): Payer: Self-pay | Admitting: Internal Medicine

## 2019-07-09 DIAGNOSIS — I10 Essential (primary) hypertension: Secondary | ICD-10-CM

## 2019-07-09 DIAGNOSIS — I2510 Atherosclerotic heart disease of native coronary artery without angina pectoris: Secondary | ICD-10-CM

## 2019-10-06 ENCOUNTER — Other Ambulatory Visit (INDEPENDENT_AMBULATORY_CARE_PROVIDER_SITE_OTHER): Payer: Self-pay | Admitting: Internal Medicine

## 2019-10-06 DIAGNOSIS — I2510 Atherosclerotic heart disease of native coronary artery without angina pectoris: Secondary | ICD-10-CM

## 2019-10-06 DIAGNOSIS — I10 Essential (primary) hypertension: Secondary | ICD-10-CM

## 2020-01-07 ENCOUNTER — Other Ambulatory Visit (INDEPENDENT_AMBULATORY_CARE_PROVIDER_SITE_OTHER): Payer: Self-pay | Admitting: Internal Medicine

## 2020-01-07 DIAGNOSIS — I10 Essential (primary) hypertension: Secondary | ICD-10-CM

## 2020-01-07 DIAGNOSIS — I2510 Atherosclerotic heart disease of native coronary artery without angina pectoris: Secondary | ICD-10-CM

## 2020-01-11 ENCOUNTER — Telehealth (INDEPENDENT_AMBULATORY_CARE_PROVIDER_SITE_OTHER): Payer: Self-pay | Admitting: Internal Medicine

## 2020-01-11 NOTE — Telephone Encounter (Signed)
Called pt to inform that Dr. Thelma Barge is referring his pt to Dr. Alinda Money.     Pt offered appointment. Pt scheduled for 02/13/20

## 2020-01-11 NOTE — Telephone Encounter (Signed)
Pts calling because he knows Dr. Thelma Barge is retiring and would like his recommendation on which cardiologist he should go with next. Pt was last seen in 08/2018.    T: [phone]

## 2020-02-13 ENCOUNTER — Ambulatory Visit (INDEPENDENT_AMBULATORY_CARE_PROVIDER_SITE_OTHER): Payer: BC Managed Care – PPO | Admitting: Cardiovascular Disease

## 2020-04-11 ENCOUNTER — Other Ambulatory Visit (INDEPENDENT_AMBULATORY_CARE_PROVIDER_SITE_OTHER): Payer: Self-pay | Admitting: Internal Medicine

## 2020-04-11 DIAGNOSIS — I10 Essential (primary) hypertension: Secondary | ICD-10-CM

## 2020-04-11 DIAGNOSIS — I2510 Atherosclerotic heart disease of native coronary artery without angina pectoris: Secondary | ICD-10-CM
